# Patient Record
Sex: Female | Born: 1970 | Race: Black or African American | Hispanic: No | Smoking: Current every day smoker
Health system: Southern US, Community
[De-identification: ages and names within clinical notes are randomized; demographics above are authoritative.]

## PROBLEM LIST (undated history)

## (undated) DIAGNOSIS — I82409 Acute embolism and thrombosis of unspecified deep veins of unspecified lower extremity: Secondary | ICD-10-CM

## (undated) DIAGNOSIS — R519 Headache, unspecified: Secondary | ICD-10-CM

## (undated) DIAGNOSIS — Z9289 Personal history of other medical treatment: Secondary | ICD-10-CM

## (undated) DIAGNOSIS — F329 Major depressive disorder, single episode, unspecified: Secondary | ICD-10-CM

## (undated) DIAGNOSIS — K861 Other chronic pancreatitis: Secondary | ICD-10-CM

## (undated) DIAGNOSIS — K219 Gastro-esophageal reflux disease without esophagitis: Secondary | ICD-10-CM

## (undated) DIAGNOSIS — F32A Depression, unspecified: Secondary | ICD-10-CM

## (undated) DIAGNOSIS — J189 Pneumonia, unspecified organism: Secondary | ICD-10-CM

## (undated) DIAGNOSIS — D573 Sickle-cell trait: Secondary | ICD-10-CM

## (undated) DIAGNOSIS — R51 Headache: Secondary | ICD-10-CM

## (undated) DIAGNOSIS — N189 Chronic kidney disease, unspecified: Secondary | ICD-10-CM

---

## 2003-08-16 HISTORY — PX: CORNEAL TRANSPLANT: SHX108

## 2005-07-15 HISTORY — PX: TUBAL LIGATION: SHX77

## 2010-08-15 DIAGNOSIS — I82409 Acute embolism and thrombosis of unspecified deep veins of unspecified lower extremity: Secondary | ICD-10-CM

## 2010-08-15 HISTORY — PX: ABDOMINAL HYSTERECTOMY: SHX81

## 2010-08-15 HISTORY — DX: Acute embolism and thrombosis of unspecified deep veins of unspecified lower extremity: I82.409

## 2013-05-21 ENCOUNTER — Emergency Department (HOSPITAL_COMMUNITY)
Admission: EM | Admit: 2013-05-21 | Discharge: 2013-05-21 | Disposition: A | Discharge status: Home / Self Care | Payer: Self-pay | Attending: Emergency Medicine | Admitting: Emergency Medicine

## 2013-05-21 ENCOUNTER — Encounter (HOSPITAL_COMMUNITY): Payer: Self-pay

## 2013-05-21 DIAGNOSIS — H6691 Otitis media, unspecified, right ear: Secondary | ICD-10-CM

## 2013-05-21 DIAGNOSIS — F17200 Nicotine dependence, unspecified, uncomplicated: Secondary | ICD-10-CM | POA: Insufficient documentation

## 2013-05-21 DIAGNOSIS — Z8719 Personal history of other diseases of the digestive system: Secondary | ICD-10-CM | POA: Insufficient documentation

## 2013-05-21 DIAGNOSIS — H6690 Otitis media, unspecified, unspecified ear: Secondary | ICD-10-CM | POA: Insufficient documentation

## 2013-05-21 DIAGNOSIS — J3489 Other specified disorders of nose and nasal sinuses: Secondary | ICD-10-CM | POA: Insufficient documentation

## 2013-05-21 MED ORDER — AMOXICILLIN 500 MG PO CAPS: 1000.0000 mg | ORAL_CAPSULE | Freq: Two times a day (BID) | ORAL | Status: DC

## 2013-05-21 NOTE — ED Provider Notes (Signed)
CSN: [id number]     Arrival date & time 05/21/13  4098 History   First MD Initiated Contact with Patient 05/21/13 [phone]     Chief Complaint  Patient presents with  . Ear Fullness   (Consider location/radiation/quality/duration/timing/severity/associated sxs/prior Treatment) Patient is a 42 y.o. female presenting with ear pain.  Otalgia Location:  Right Behind ear:  No abnormality Quality:  Aching Severity:  Severe Onset quality:  Gradual Timing:  Constant Progression:  Worsening Chronicity:  New Context: not foreign body in ear and not loud noise   Relieved by:  Nothing Worsened by:  Nothing tried Associated symptoms: congestion and rhinorrhea   Associated symptoms: no abdominal pain, no cough, no diarrhea, no ear discharge, no fever, no rash, no sore throat, no tinnitus and no vomiting     Past Medical History  Diagnosis Date  . Acute pancreatitis    History reviewed. No pertinent past surgical history. History reviewed. No pertinent family history. History  Substance Use Topics  . Smoking status: Current Every Day Smoker  . Smokeless tobacco: Not on file  . Alcohol Use: No   OB History   Grav Para Term Preterm Abortions TAB SAB Ect Mult Living                 Review of Systems  Constitutional: Negative for fever and chills.  HENT: Positive for ear pain, congestion and rhinorrhea. Negative for sore throat, tinnitus and ear discharge.   Eyes: Negative for photophobia and visual disturbance.  Respiratory: Negative for cough and shortness of breath.   Cardiovascular: Negative for chest pain and leg swelling.  Gastrointestinal: Negative for nausea, vomiting, abdominal pain, diarrhea and constipation.  Endocrine: Negative for polyphagia and polyuria.  Genitourinary: Negative for dysuria, flank pain, vaginal bleeding, vaginal discharge and enuresis.  Musculoskeletal: Negative for back pain and gait problem.  Skin: Negative for color change and rash.  Neurological:  Negative for dizziness, syncope, light-headedness and numbness.  Hematological: Negative for adenopathy. Does not bruise/bleed easily.  All other systems reviewed and are negative.    Allergies  Sulfa antibiotics  Home Medications   Current Outpatient Rx  Name  Route  Sig  Dispense  Refill  . carbamide peroxide (DEBROX) 6.5 % otic solution   Right Ear   Place 5 drops into the right ear as needed.         . diphenhydrAMINE (BENADRYL) 25 MG tablet   Oral   Take 75 mg by mouth every 6 (six) hours as needed for itching.         Marland Kitchen amoxicillin (AMOXIL) 500 MG capsule   Oral   Take 2 capsules (1,000 mg total) by mouth 2 (two) times daily.   40 capsule   0    BP 139/90  Pulse 74  Temp(Src) 97.1 F (36.2 C) (Oral)  Resp 18  Ht 5\' 1"  (1.549 m)  Wt 172 lb (78.019 kg)  BMI 32.52 kg/m2  SpO2 98% Physical Exam  Vitals reviewed. Constitutional: She is oriented to person, place, and time. She appears well-developed and well-nourished.  HENT:  Head: Normocephalic and atraumatic.  Right Ear: External ear normal. Tympanic membrane is injected, erythematous and bulging. A middle ear effusion is present.  Left Ear: Tympanic membrane and external ear normal.  Eyes: Conjunctivae and EOM are normal. Pupils are equal, round, and reactive to light.  Neck: Normal range of motion. Neck supple.  Cardiovascular: Normal rate, regular rhythm, normal heart sounds and intact distal pulses.  Pulmonary/Chest: Effort normal and breath sounds normal.  Abdominal: Soft. Bowel sounds are normal. There is no tenderness.  Musculoskeletal: Normal range of motion.  Neurological: She is alert and oriented to person, place, and time.  Skin: Skin is warm and dry.    ED Course  Procedures (including critical care time) Labs Review Labs Reviewed - No data to display Imaging Review No results found.  MDM   1. Acute otitis media, right    42 y.o. female  with pertinent PMH of seasonal allergies  presents with R ear pain x 9 days with acute worsening over last 3.  Pt presents today due to progressive hearing loss.  Physical exam with L TM normal,  canal unremarkable, Hyperemic superior TM, unable to visualize entirety of TM.  No reported ottorhea.  Pt given precautions for perforated TM, will have her fu with ENT if her symptoms do not improve.  Spoke with pt on phone and she had already called ENT.  DC home in stable condition.    Labs and imaging as above reviewed by myself and attending,Dr. Rhunette Croft, with whom case was discussed.   1. Acute otitis media, right         Noel Gerold, MD 05/21/13 1021

## 2013-05-21 NOTE — ED Notes (Signed)
Pt here for difficulty hearing and ear pain on right side of face, pt has not taken meds prior to arrival, sts he doesn't take meds. sts has "distance" hearing and it is muffled.

## 2013-05-21 NOTE — ED Notes (Signed)
MD at bedside. When i attempted to triage pt.

## 2013-05-23 NOTE — ED Provider Notes (Signed)
I performed a history and physical examination of  Alyssa Flowers and discussed her management with Dr. Littie Deeds. I agree with the history, physical, assessment, and plan of care, with the following exceptions: None I was present for the following procedures: None  Time Spent in Critical Care of the patient: None  Time spent in discussions with the patient and family: 5 minutes  Alyssa Flowers  Pt comes in with hearing loss. Exam of the affected ear reveals loss of landmarks and i certainly cant see the TM. No drainage, but will tx as perforated TM and have ENT f.u   Derwood Kaplan, MD 05/23/13 [phone]

## 2014-05-12 ENCOUNTER — Inpatient Hospital Stay (HOSPITAL_COMMUNITY)
Admission: AD | Admit: 2014-05-12 | Discharge: 2014-05-12 | Disposition: A | Discharge status: Home / Self Care | Payer: Self-pay | Source: Ambulatory Visit | Attending: Family Medicine | Admitting: Family Medicine

## 2014-05-12 ENCOUNTER — Encounter (HOSPITAL_COMMUNITY): Payer: Self-pay

## 2014-05-12 DIAGNOSIS — N390 Urinary tract infection, site not specified: Secondary | ICD-10-CM | POA: Insufficient documentation

## 2014-05-12 DIAGNOSIS — F17200 Nicotine dependence, unspecified, uncomplicated: Secondary | ICD-10-CM | POA: Insufficient documentation

## 2014-05-12 LAB — URINALYSIS, ROUTINE W REFLEX MICROSCOPIC
BILIRUBIN URINE: NEGATIVE
GLUCOSE, UA: NEGATIVE mg/dL
KETONES UR: NEGATIVE mg/dL
Nitrite: POSITIVE — AB
PH: 6 (ref 5.0–8.0)
Protein, ur: NEGATIVE mg/dL
SPECIFIC GRAVITY, URINE: 1.01 (ref 1.005–1.030)
Urobilinogen, UA: 0.2 mg/dL (ref 0.0–1.0)

## 2014-05-12 LAB — URINE MICROSCOPIC-ADD ON

## 2014-05-12 MED ORDER — PHENAZOPYRIDINE HCL 200 MG PO TABS: 200.0000 mg | ORAL_TABLET | Freq: Three times a day (TID) | ORAL | Status: DC

## 2014-05-12 MED ORDER — CIPROFLOXACIN HCL 500 MG PO TABS: 500.0000 mg | ORAL_TABLET | Freq: Two times a day (BID) | ORAL | Status: DC

## 2014-05-12 NOTE — MAU Provider Note (Signed)
History    CSN: [id number]  Arrival date and time: 05/12/14 1340   First Provider Initiated Contact with Patient 05/12/14 1522      Chief Complaint  Patient presents with  . Dysuria   The history is provided by the patient.   Alyssa Flowers is a 43 y.o. female with a surgical history hysterectomy and a PMH of acute pancreatitis complaining of abdominal and flank pain. Patient states she noticed abnormal odor from her vagina about a month ago accompanied by diarrhea, concern for a UTI she began hydrating, drinking cranberry juice, and taking Tylenol. Two weeks later she began experiencing dysuria with worsening odors. 1 week ago she began having flank pain with urination. She localizes the pain at her lower abdomen and up her left flank. She describes the pain as pressure and cramps that becomes sharp when she urinates. Over the weekend she experienced a fever of 101 degrees Farenheit relieved by  tylenol and aspirin. The fever was accompanied by chills. Pt denies vaginal bleeding/discharge and new sexual partners. She does not want pelvic exam and is not concerned for STI's. For more ROS See below.  OB History   Grav Para Term Preterm Abortions TAB SAB Ect Mult Living                  Past Medical History  Diagnosis Date  . Acute pancreatitis     Past Surgical History  Procedure Laterality Date  . Abdominal hysterectomy      History reviewed. No pertinent family history.  History  Substance Use Topics  . Smoking status: Current Every Day Smoker  . Smokeless tobacco: Not on file  . Alcohol Use: No    Allergies:  Allergies  Allergen Reactions  . Sulfa Antibiotics Anaphylaxis and Hives    Prescriptions prior to admission  Medication Sig Dispense Refill  . aspirin 325 MG tablet Take 650 mg by mouth as needed for moderate pain.       Results for orders placed during the hospital encounter of 05/12/14 (from the past 24 hour(s))  URINALYSIS, ROUTINE W REFLEX MICROSCOPIC      Status: Abnormal   Collection Time    05/12/14  2:43 PM      Result Value Ref Range   Color, Urine YELLOW  YELLOW   APPearance CLOUDY (*) CLEAR   Specific Gravity, Urine 1.010  1.005 - 1.030   pH 6.0  5.0 - 8.0   Glucose, UA NEGATIVE  NEGATIVE mg/dL   Hgb urine dipstick SMALL (*) NEGATIVE   Bilirubin Urine NEGATIVE  NEGATIVE   Ketones, ur NEGATIVE  NEGATIVE mg/dL   Protein, ur NEGATIVE  NEGATIVE mg/dL   Urobilinogen, UA 0.2  0.0 - 1.0 mg/dL   Nitrite POSITIVE (*) NEGATIVE   Leukocytes, UA LARGE (*) NEGATIVE  URINE MICROSCOPIC-ADD ON     Status: Abnormal   Collection Time    05/12/14  2:43 PM      Result Value Ref Range   Squamous Epithelial / LPF MANY (*) RARE   WBC, UA TOO NUMEROUS TO COUNT  <3 WBC/hpf   RBC / HPF 7-10  <3 RBC/hpf   Bacteria, UA MANY (*) RARE   Urine-Other MUCOUS PRESENT       Review of Systems  Constitutional: Positive for fever, chills and malaise/fatigue. Negative for weight loss and diaphoresis.  HENT: Negative for congestion and sore throat.   Eyes: Negative for blurred vision.  Respiratory: Negative for cough.   Cardiovascular: Negative for  chest pain.  Gastrointestinal: Positive for nausea, vomiting, abdominal pain and diarrhea. Negative for constipation and blood in stool.  Genitourinary: Positive for dysuria, urgency, frequency and flank pain. Negative for hematuria.  Skin: Negative for itching and rash.  Neurological: Positive for headaches. Negative for dizziness and weakness.   Physical Exam   Blood pressure 120/79, pulse 80, temperature 98.4 F (36.9 C), temperature source Oral, resp. rate 18, height  (1.575 m), weight 80.287 kg (177 lb).  Physical Exam  Constitutional: She is oriented to person, place, and time. She appears well-developed and well-nourished. She appears distressed (mild).  HENT:  Head: Normocephalic.  Eyes: EOM are normal.  Neck: Normal range of motion.  Cardiovascular: Normal rate, regular rhythm and normal heart  sounds.   Respiratory: Effort normal.  GI: Soft. Normal appearance and bowel sounds are normal. There is tenderness in the suprapubic area. There is CVA tenderness (left mild).  Neurological: She is alert and oriented to person, place, and time.  Skin: Skin is warm and dry.  Psychiatric: She has a normal mood and affect. Her behavior is normal.  Pelvic exam: exam declined by the patient.  MAU Course  Procedures  MDM Patient stable during visit at MAU   Assessment and Plan  A: Uncomplicated UTI  P: Cipro       Pyridium     Stable for discharge without fever, nausea, vomiting or other problems. Will treat outpatient. She will return for worsening symptoms.  Discussed with the patient and all questioned fully answered.    Medication List         aspirin 325 MG tablet  Take 650 mg by mouth as needed for moderate pain.     ciprofloxacin 500 MG tablet  Commonly known as:  CIPRO  Take 1 tablet (500 mg total) by mouth 2 (two) times daily.     phenazopyridine 200 MG tablet  Commonly known as:  PYRIDIUM  Take 1 tablet (200 mg total) by mouth 3 (three) times daily.           Rosemary Holms 05/12/2014, 3:26 PM

## 2014-05-12 NOTE — MAU Provider Note (Signed)
Attestation of Attending Supervision of Advanced Practitioner (PA/CNM/NP): Evaluation and management procedures were performed by the Advanced Practitioner under my supervision and collaboration.  I have reviewed the Advanced Practitioner's note and chart, and I agree with the management and plan.  Rowena Moilanen, DO Attending Physician Faculty Practice, [facility]   

## 2014-05-12 NOTE — MAU Note (Addendum)
Think has a urinary tract inf.  Urine is hazy orange color, strong odor.  Pain in lower abd. Frequency/ urgeny, pain and pressure with urination.  Has had total hyst- no other reason. Fever last 2 days, up to 101

## 2014-06-26 ENCOUNTER — Inpatient Hospital Stay (HOSPITAL_COMMUNITY): Payer: Self-pay

## 2014-06-26 ENCOUNTER — Inpatient Hospital Stay (HOSPITAL_COMMUNITY)
Admission: AD | Admit: 2014-06-26 | Discharge: 2014-06-26 | Disposition: A | Discharge status: Home / Self Care | Payer: Self-pay | Source: Ambulatory Visit | Attending: Obstetrics & Gynecology | Admitting: Obstetrics & Gynecology

## 2014-06-26 ENCOUNTER — Encounter (HOSPITAL_COMMUNITY): Payer: Self-pay | Admitting: *Deleted

## 2014-06-26 DIAGNOSIS — Z90710 Acquired absence of both cervix and uterus: Secondary | ICD-10-CM | POA: Diagnosis present

## 2014-06-26 DIAGNOSIS — R109 Unspecified abdominal pain: Secondary | ICD-10-CM | POA: Insufficient documentation

## 2014-06-26 DIAGNOSIS — E669 Obesity, unspecified: Secondary | ICD-10-CM | POA: Diagnosis present

## 2014-06-26 DIAGNOSIS — F17200 Nicotine dependence, unspecified, uncomplicated: Secondary | ICD-10-CM | POA: Diagnosis present

## 2014-06-26 DIAGNOSIS — R05 Cough: Secondary | ICD-10-CM | POA: Insufficient documentation

## 2014-06-26 DIAGNOSIS — R059 Cough, unspecified: Secondary | ICD-10-CM

## 2014-06-26 LAB — URINE MICROSCOPIC-ADD ON

## 2014-06-26 LAB — CBC
HCT: 32.9 % — ABNORMAL LOW (ref 36.0–46.0)
Hemoglobin: 11.8 g/dL — ABNORMAL LOW (ref 12.0–15.0)
MCH: 30.2 pg (ref 26.0–34.0)
MCHC: 35.9 g/dL (ref 30.0–36.0)
MCV: 84.1 fL (ref 78.0–100.0)
PLATELETS: 311 10*3/uL (ref 150–400)
RBC: 3.91 MIL/uL (ref 3.87–5.11)
RDW: 15.3 % (ref 11.5–15.5)
WBC: 11.3 10*3/uL — AB (ref 4.0–10.5)

## 2014-06-26 LAB — URINALYSIS, ROUTINE W REFLEX MICROSCOPIC
Bilirubin Urine: NEGATIVE
Glucose, UA: NEGATIVE mg/dL
KETONES UR: NEGATIVE mg/dL
LEUKOCYTES UA: NEGATIVE
NITRITE: NEGATIVE
PROTEIN: NEGATIVE mg/dL
Specific Gravity, Urine: <1.005 — ABNORMAL LOW (ref 1.005–1.030)
UROBILINOGEN UA: 0.2 mg/dL (ref 0.0–1.0)
pH: 5.5 (ref 5.0–8.0)

## 2014-06-26 LAB — COMPREHENSIVE METABOLIC PANEL
ALBUMIN: 3.5 g/dL (ref 3.5–5.2)
ALK PHOS: 72 U/L (ref 39–117)
ALT: 19 U/L (ref 0–35)
ANION GAP: 14 (ref 5–15)
AST: 21 U/L (ref 0–37)
BUN: 7 mg/dL (ref 6–23)
CHLORIDE: 102 meq/L (ref 96–112)
CO2: 23 mEq/L (ref 19–32)
Calcium: 9.1 mg/dL (ref 8.4–10.5)
Creatinine, Ser: 0.81 mg/dL (ref 0.50–1.10)
GFR calc Af Amer: >90 mL/min (ref >90)
GFR calc non Af Amer: 90 mL/min — ABNORMAL LOW (ref >90)
Glucose, Bld: 108 mg/dL — ABNORMAL HIGH (ref 70–99)
POTASSIUM: 3.4 meq/L — AB (ref 3.7–5.3)
Sodium: 139 mEq/L (ref 137–147)
TOTAL PROTEIN: 7.1 g/dL (ref 6.0–8.3)
Total Bilirubin: 0.3 mg/dL (ref 0.3–1.2)

## 2014-06-26 MED ORDER — PROMETHAZINE HCL 25 MG PO TABS
25.0000 mg | ORAL_TABLET | Freq: Once | ORAL | Status: AC
  Administered 2014-06-26: 25 mg via ORAL
  Filled 2014-06-26: qty 1

## 2014-06-26 MED ORDER — OXYCODONE-ACETAMINOPHEN 5-325 MG PO TABS: 1.0000 | ORAL_TABLET | ORAL | Status: DC | PRN

## 2014-06-26 MED ORDER — AZITHROMYCIN 250 MG PO TABS: ORAL_TABLET | ORAL | Status: DC

## 2014-06-26 MED ORDER — PROMETHAZINE HCL 25 MG PO TABS: 25.0000 mg | ORAL_TABLET | Freq: Four times a day (QID) | ORAL | Status: AC | PRN

## 2014-06-26 MED ORDER — OXYCODONE-ACETAMINOPHEN 5-325 MG PO TABS
1.0000 | ORAL_TABLET | Freq: Once | ORAL | Status: AC
  Administered 2014-06-26: 1 via ORAL
  Filled 2014-06-26: qty 1

## 2014-06-26 NOTE — MAU Note (Signed)
Pt states she has had a cough 10days when she states she had a cold. Pt states she has been taking over the counter medication. Pt states she feels like she has had "the hernia's for a while, now it is starting to protrude out. I have not felt this for a while. Pt states she felt a warm sensation around the incision area. From C-section she had 08/18/2010,

## 2014-06-26 NOTE — MAU Provider Note (Signed)
History     CSN: [id number]  Arrival date and time: 06/26/14 [id number]   First Provider Initiated Contact with Patient 06/26/14 0126      Chief Complaint  Patient presents with  . Abdominal Pain  . Cough   HPI Comments: Alyssa Flowers 43 y.o. Alyssa [id number] presents to MAU with severe cough that is ongoing for 10 day and productive of yellow sputum. She denies any fever. She is a smoker. She has been coughing so much she feels she had created a hernia in her hysterectomy site or pulled a muscle. [place] She has hx acute pancreatitis related to trauma.  Pt does not have PCP or see MD.  Abdominal Pain  Cough      Past Medical History  Diagnosis Date  . Acute pancreatitis     Past Surgical History  Procedure Laterality Date  . Abdominal hysterectomy      Family History  Problem Relation Age of Onset  . Hypertension Mother   . Hyperlipidemia Mother   . Heart disease Father   . Hypertension Father   . Hyperlipidemia Father     History  Substance Use Topics  . Smoking status: Current Every Day Smoker  . Smokeless tobacco: Not on file  . Alcohol Use: No    Allergies:  Allergies  Allergen Reactions  . Sulfa Antibiotics Anaphylaxis and Hives    Prescriptions prior to admission  Medication Sig Dispense Refill Last Dose  . aspirin 325 MG tablet Take 650 mg by mouth as needed for moderate pain.   06/25/2014 at Unknown time  . ciprofloxacin (CIPRO) 500 MG tablet Take 1 tablet (500 mg total) by mouth 2 (two) times daily. 14 tablet 0   . phenazopyridine (PYRIDIUM) 200 MG tablet Take 1 tablet (200 mg total) by mouth 3 (three) times daily. 6 tablet 0     Review of Systems  Constitutional: Negative.   HENT: Negative.   Respiratory: Positive for cough.   Cardiovascular: Negative.   Gastrointestinal: Positive for abdominal pain.  Genitourinary: Negative.   Musculoskeletal: Negative.   Skin: Negative.   Neurological: Negative.    Psychiatric/Behavioral: Negative.    Physical Exam   Blood pressure 130/86, pulse 87, temperature 97.6 F (36.4 C), temperature source Oral, resp. rate 20, SpO2 100 %.  Physical Exam  Constitutional: She is oriented to person, place, and time. She appears well-developed and well-nourished.  Coughing is significant  HENT:  Head: Normocephalic and atraumatic.  Eyes: Pupils are equal, round, and reactive to light.  Cardiovascular: Normal rate, regular rhythm and normal heart sounds.   Respiratory: Effort normal.  Lungs have distant breath sounds that what maybe consolidation on right side  GI:  Old well healed scar /tint to skin around area of scar/ tender to touch/ nothing protruding / no evidence of hernia  Genitourinary:  Not examined  Musculoskeletal: Normal range of motion.  Neurological: She is alert and oriented to person, place, and time.  Skin: Skin is warm and dry.  Psychiatric: She has a normal mood and affect. Her behavior is normal. Judgment and thought content normal.   Results for orders placed or performed during the hospital encounter of 06/26/14 (from the past 24 hour(s))  CBC     Status: Abnormal   Collection Time: 06/26/14  2:13 AM  Result Value Ref Range   WBC 11.3 (H) 4.0 - 10.5 K/uL   RBC 3.91 3.87 - 5.11 MIL/uL   Hemoglobin 11.8 (L)  12.0 - 15.0 g/dL   HCT 78.232.9 (L) 95.636.0 - 21.346.0 %   MCV 84.1 78.0 - 100.0 fL   MCH 30.2 26.0 - 34.0 pg   MCHC 35.9 30.0 - 36.0 g/dL   RDW 08.615.3 57.811.5 - 46.915.5 %   Platelets 311 150 - 400 K/uL  Comprehensive metabolic panel     Status: Abnormal   Collection Time: 06/26/14  2:13 AM  Result Value Ref Range   Sodium 139 137 - 147 mEq/L   Potassium 3.4 (L) 3.7 - 5.3 mEq/L   Chloride 102 96 - 112 mEq/L   CO2 23 19 - 32 mEq/L   Glucose, Bld 108 (H) 70 - 99 mg/dL   BUN 7 6 - 23 mg/dL   Creatinine, Ser 6.290.81 0.50 - 1.10 mg/dL   Calcium 9.1 8.4 - 52.810.5 mg/dL   Total Protein 7.1 6.0 - 8.3 g/dL   Albumin 3.5 3.5 - 5.2 g/dL   AST 21 0 -  37 U/L   ALT 19 0 - 35 U/L   Alkaline Phosphatase 72 39 - 117 U/L   Total Bilirubin 0.3 0.3 - 1.2 mg/dL   GFR calc non Af Amer 90 (L) >90 mL/min   GFR calc Af Amer >90 >90 mL/min   Anion gap 14 5 - 15   Dg Chest 2 View  06/26/2014   CLINICAL DATA:  Cough and cold 10 days.  EXAM: CHEST  2 VIEW  COMPARISON:  None.  FINDINGS: Lungs are adequately inflated with minimal tenting of the right hemidiaphragm likely atelectasis. Cardiomediastinal silhouette, bones and soft tissues are normal.  IMPRESSION: No active cardiopulmonary disease.   Electronically Signed   By: Elberta Fortisaniel  Boyle M.D.   On: 06/26/2014 02:45   O2 sat 97%  MAU Course  Procedures  MDM  Percocet/ Phenergan/ improved cough and pain CBC/ CMET/UA/ CXR/ O2sat  Assessment and Plan   A: Cough  P: Above orders reviewed with patient and husband Z-Pack as directed Increase fluids/ rest Percocet/ phenergan for cough and pain Note to be out of work till Monday Advised to find PCP Advised to stop smoking   Carolynn ServeBarefoot, Alyssa Flowers 06/26/2014, 1:49 AM

## 2014-06-26 NOTE — MAU Note (Signed)
Pt reports she thinks she has a hernia at her hysterectomy site. Hysterectomy was in 2012, pt reports she has been coughing and now has a lot of pain in that area.

## 2014-06-26 NOTE — Progress Notes (Signed)
Pt states pain increase to a 10 when she coughs

## 2014-06-26 NOTE — Discharge Instructions (Signed)
Cool Mist Vaporizers Vaporizers may help relieve the symptoms of a cough and cold. They add moisture to the air, which helps mucus to become thinner and less sticky. This makes it easier to breathe and cough up secretions. Cool mist vaporizers do not cause serious burns like hot mist vaporizers, which may also be called steamers or humidifiers. Vaporizers have not been proven to help with colds. You should not use a vaporizer if you are allergic to mold. HOME CARE INSTRUCTIONS  Follow the package instructions for the vaporizer.  Do not use anything other than distilled water in the vaporizer.  Do not run the vaporizer all of the time. This can cause mold or bacteria to grow in the vaporizer.  Clean the vaporizer after each time it is used.  Clean and dry the vaporizer well before storing it.  Stop using the vaporizer if worsening respiratory symptoms develop. Document Released: 04/28/2004 Document Revised: 08/06/2013 Document Reviewed: 12/19/2012 [facility] IncExitCare Patient Information 2015 [place], [place]. This information is not intended to replace advice given to you by your health care provider. Make sure you discuss any questions you have with your health care provider.  Cough, Adult  A cough is a reflex. It helps you clear your throat and airways. A cough can help heal your body. A cough can last 2 or 3 weeks (acute) or may last more than 8 weeks (chronic). Some common causes of a cough can include an infection, allergy, or a cold. HOME CARE  Only take medicine as told by your doctor.  If given, take your medicines (antibiotics) as told. Finish them even if you start to feel better.  Use a cold steam vaporizer or humidifier in your home. This can help loosen thick spit (secretions).  Sleep so you are almost sitting up (semi-upright). Use pillows to do this. This helps reduce coughing.  Rest as needed.  Stop smoking if you smoke. GET HELP RIGHT AWAY IF:  You have yellowish-white fluid  (pus) in your thick spit.  Your cough gets worse.  Your medicine does not reduce coughing, and you are losing sleep.  You cough up blood.  You have trouble breathing.  Your pain gets worse and medicine does not help.  You have a fever. MAKE SURE YOU:   Understand these instructions.  Will watch your condition.  Will get help right away if you are not doing well or get worse. Document Released: 04/14/2011 Document Revised: 12/16/2013 Document Reviewed: 04/14/2011 [facility] Patient Information 2015 [employer] [place]Care, [place]. This information is not intended to replace advice given to you by your health care provider. Make sure you discuss any questions you have with your health care provider. Pneumonia Pneumonia is an infection of the lungs.  CAUSES Pneumonia may be caused by bacteria or a virus. Usually, these infections are caused by breathing infectious particles into the lungs (respiratory tract). SIGNS AND SYMPTOMS   Cough.  Fever.  Chest pain.  Increased rate of breathing.  Wheezing.  Mucus production. DIAGNOSIS  If you have the common symptoms of pneumonia, your health care provider will typically confirm the diagnosis with a chest X-ray. The X-ray will show an abnormality in the lung (pulmonary infiltrate) if you have pneumonia. Other tests of your blood, urine, or sputum may be done to find the specific cause of your pneumonia. Your health care provider may also do tests (blood gases or pulse oximetry) to see how well your lungs are working. TREATMENT  Some forms of pneumonia may be spread to  other people when you cough or sneeze. You may be asked to wear a mask before and during your exam. Pneumonia that is caused by bacteria is treated with antibiotic medicine. Pneumonia that is caused by the influenza virus may be treated with an antiviral medicine. Most other viral infections must run their course. These infections will not respond to antibiotics.  HOME CARE INSTRUCTIONS     Cough suppressants may be used if you are losing too much rest. However, coughing protects you by clearing your lungs. You should avoid using cough suppressants if you can.  Your health care provider may have prescribed medicine if he or she thinks your pneumonia is caused by bacteria or influenza. Finish your medicine even if you start to feel better.  Your health care provider may also prescribe an expectorant. This loosens the mucus to be coughed up.  Take medicines only as directed by your health care provider.  Do not smoke. Smoking is a common cause of bronchitis and can contribute to pneumonia. If you are a smoker and continue to smoke, your cough may last several weeks after your pneumonia has cleared.  A cold steam vaporizer or humidifier in your room or home may help loosen mucus.  Coughing is often worse at night. Sleeping in a semi-upright position in a recliner or using a couple pillows under your head will help with this.  Get rest as you feel it is needed. Your body will usually let you know when you need to rest. PREVENTION A pneumococcal shot (vaccine) is available to prevent a common bacterial cause of pneumonia. This is usually suggested for:  People over 41 years old.  Patients on chemotherapy.  People with chronic lung problems, such as bronchitis or emphysema.  People with immune system problems. If you are over 65 or have a high risk condition, you may receive the pneumococcal vaccine if you have not received it before. In some countries, a routine influenza vaccine is also recommended. This vaccine can help prevent some cases of pneumonia.You may be offered the influenza vaccine as part of your care. If you smoke, it is time to quit. You may receive instructions on how to stop smoking. Your health care provider can provide medicines and counseling to help you quit. SEEK MEDICAL CARE IF: You have a fever. SEEK IMMEDIATE MEDICAL CARE IF:   Your illness  becomes worse. This is especially true if you are elderly or weakened from any other disease.  You cannot control your cough with suppressants and are losing sleep.  You begin coughing up blood.  You develop pain which is getting worse or is uncontrolled with medicines.  Any of the symptoms which initially brought you in for treatment are getting worse rather than better.  You develop shortness of breath or chest pain. MAKE SURE YOU:   Understand these instructions.  Will watch your condition.  Will get help right away if you are not doing well or get worse. Document Released: 08/01/2005 Document Revised: 12/16/2013 Document Reviewed: 10/21/2010 [facility] HospitalExitCare Patient Information 2015 [place], [place]LL[place]. This information is not intended to replace advice given to you by your health care provider. Make sure you discuss any questions you have with your health care provider.

## 2015-02-05 ENCOUNTER — Observation Stay (HOSPITAL_COMMUNITY)
Admission: EM | Admit: 2015-02-05 | Discharge: 2015-02-06 | Disposition: A | Discharge status: Home / Self Care | Payer: Medicaid Other | Attending: Internal Medicine | Admitting: Internal Medicine

## 2015-02-05 ENCOUNTER — Other Ambulatory Visit (HOSPITAL_COMMUNITY): Payer: Self-pay

## 2015-02-05 ENCOUNTER — Emergency Department (HOSPITAL_COMMUNITY): Payer: Medicaid Other

## 2015-02-05 ENCOUNTER — Encounter (HOSPITAL_COMMUNITY): Payer: Self-pay | Admitting: *Deleted

## 2015-02-05 ENCOUNTER — Other Ambulatory Visit: Payer: Self-pay

## 2015-02-05 DIAGNOSIS — R0602 Shortness of breath: Secondary | ICD-10-CM | POA: Insufficient documentation

## 2015-02-05 DIAGNOSIS — R0981 Nasal congestion: Secondary | ICD-10-CM | POA: Insufficient documentation

## 2015-02-05 DIAGNOSIS — E876 Hypokalemia: Secondary | ICD-10-CM | POA: Diagnosis present

## 2015-02-05 DIAGNOSIS — E669 Obesity, unspecified: Secondary | ICD-10-CM | POA: Diagnosis not present

## 2015-02-05 DIAGNOSIS — K859 Acute pancreatitis, unspecified: Secondary | ICD-10-CM | POA: Insufficient documentation

## 2015-02-05 DIAGNOSIS — F17210 Nicotine dependence, cigarettes, uncomplicated: Secondary | ICD-10-CM | POA: Insufficient documentation

## 2015-02-05 DIAGNOSIS — R059 Cough, unspecified: Secondary | ICD-10-CM

## 2015-02-05 DIAGNOSIS — J302 Other seasonal allergic rhinitis: Secondary | ICD-10-CM | POA: Insufficient documentation

## 2015-02-05 DIAGNOSIS — E878 Other disorders of electrolyte and fluid balance, not elsewhere classified: Secondary | ICD-10-CM

## 2015-02-05 DIAGNOSIS — J329 Chronic sinusitis, unspecified: Secondary | ICD-10-CM | POA: Diagnosis not present

## 2015-02-05 DIAGNOSIS — Z7982 Long term (current) use of aspirin: Secondary | ICD-10-CM | POA: Diagnosis not present

## 2015-02-05 DIAGNOSIS — R05 Cough: Secondary | ICD-10-CM | POA: Diagnosis present

## 2015-02-05 DIAGNOSIS — E785 Hyperlipidemia, unspecified: Secondary | ICD-10-CM | POA: Diagnosis not present

## 2015-02-05 DIAGNOSIS — Z720 Tobacco use: Secondary | ICD-10-CM | POA: Diagnosis present

## 2015-02-05 DIAGNOSIS — K861 Other chronic pancreatitis: Secondary | ICD-10-CM | POA: Diagnosis not present

## 2015-02-05 DIAGNOSIS — J189 Pneumonia, unspecified organism: Secondary | ICD-10-CM | POA: Diagnosis not present

## 2015-02-05 DIAGNOSIS — Z882 Allergy status to sulfonamides status: Secondary | ICD-10-CM | POA: Insufficient documentation

## 2015-02-05 DIAGNOSIS — K219 Gastro-esophageal reflux disease without esophagitis: Secondary | ICD-10-CM | POA: Diagnosis not present

## 2015-02-05 DIAGNOSIS — R079 Chest pain, unspecified: Secondary | ICD-10-CM | POA: Diagnosis not present

## 2015-02-05 DIAGNOSIS — Z881 Allergy status to other antibiotic agents status: Secondary | ICD-10-CM | POA: Diagnosis not present

## 2015-02-05 DIAGNOSIS — Z6834 Body mass index (BMI) 34.0-34.9, adult: Secondary | ICD-10-CM | POA: Diagnosis not present

## 2015-02-05 DIAGNOSIS — E538 Deficiency of other specified B group vitamins: Secondary | ICD-10-CM | POA: Diagnosis present

## 2015-02-05 DIAGNOSIS — D649 Anemia, unspecified: Secondary | ICD-10-CM | POA: Diagnosis not present

## 2015-02-05 DIAGNOSIS — R7309 Other abnormal glucose: Secondary | ICD-10-CM

## 2015-02-05 DIAGNOSIS — R7303 Prediabetes: Secondary | ICD-10-CM | POA: Diagnosis present

## 2015-02-05 HISTORY — DX: Major depressive disorder, single episode, unspecified: F32.9

## 2015-02-05 HISTORY — DX: Headache: R51

## 2015-02-05 HISTORY — DX: Headache, unspecified: R51.9

## 2015-02-05 HISTORY — DX: Pneumonia, unspecified organism: J18.9

## 2015-02-05 HISTORY — DX: Acute embolism and thrombosis of unspecified deep veins of unspecified lower extremity: I82.409

## 2015-02-05 HISTORY — DX: Personal history of other medical treatment: Z92.89

## 2015-02-05 HISTORY — DX: Sickle-cell trait: D57.3

## 2015-02-05 HISTORY — DX: Chronic kidney disease, unspecified: N18.9

## 2015-02-05 HISTORY — DX: Other chronic pancreatitis: K86.1

## 2015-02-05 HISTORY — DX: Gastro-esophageal reflux disease without esophagitis: K21.9

## 2015-02-05 HISTORY — DX: Depression, unspecified: F32.A

## 2015-02-05 LAB — URINALYSIS, ROUTINE W REFLEX MICROSCOPIC
BILIRUBIN URINE: NEGATIVE
GLUCOSE, UA: NEGATIVE mg/dL
Ketones, ur: NEGATIVE mg/dL
LEUKOCYTES UA: NEGATIVE
Nitrite: POSITIVE — AB
PROTEIN: NEGATIVE mg/dL
Specific Gravity, Urine: 1.008 (ref 1.005–1.030)
Urobilinogen, UA: 0.2 mg/dL (ref 0.0–1.0)
pH: 6 (ref 5.0–8.0)

## 2015-02-05 LAB — LIPID PANEL
Cholesterol: 233 mg/dL — ABNORMAL HIGH (ref 0–200)
HDL: 32 mg/dL — ABNORMAL LOW (ref >40)
LDL CALC: 151 mg/dL — AB (ref 0–99)
TRIGLYCERIDES: 251 mg/dL — AB (ref <150)
Total CHOL/HDL Ratio: 7.3 RATIO
VLDL: 50 mg/dL — ABNORMAL HIGH (ref 0–40)

## 2015-02-05 LAB — CBC WITH DIFFERENTIAL/PLATELET
Basophils Absolute: 0 10*3/uL (ref 0.0–0.1)
Basophils Relative: 0 % (ref 0–1)
EOS ABS: 0.2 10*3/uL (ref 0.0–0.7)
Eosinophils Relative: 3 % (ref 0–5)
HEMATOCRIT: 34.6 % — AB (ref 36.0–46.0)
Hemoglobin: 11.8 g/dL — ABNORMAL LOW (ref 12.0–15.0)
LYMPHS PCT: 39 % (ref 12–46)
Lymphs Abs: 2.7 10*3/uL (ref 0.7–4.0)
MCH: 27.6 pg (ref 26.0–34.0)
MCHC: 34.1 g/dL (ref 30.0–36.0)
MCV: 80.8 fL (ref 78.0–100.0)
Monocytes Absolute: 0.6 10*3/uL (ref 0.1–1.0)
Monocytes Relative: 9 % (ref 3–12)
Neutro Abs: 3.4 10*3/uL (ref 1.7–7.7)
Neutrophils Relative %: 49 % (ref 43–77)
PLATELETS: 323 10*3/uL (ref 150–400)
RBC: 4.28 MIL/uL (ref 3.87–5.11)
RDW: 14.9 % (ref 11.5–15.5)
WBC: 6.9 10*3/uL (ref 4.0–10.5)

## 2015-02-05 LAB — COMPREHENSIVE METABOLIC PANEL
ALBUMIN: 3.6 g/dL (ref 3.5–5.0)
ALT: 23 U/L (ref 14–54)
AST: 28 U/L (ref 15–41)
Alkaline Phosphatase: 67 U/L (ref 38–126)
Anion gap: 7 (ref 5–15)
BUN: <5 mg/dL — ABNORMAL LOW (ref 6–20)
CALCIUM: 9.3 mg/dL (ref 8.9–10.3)
CO2: 27 mmol/L (ref 22–32)
Chloride: 105 mmol/L (ref 101–111)
Creatinine, Ser: 0.92 mg/dL (ref 0.44–1.00)
GFR calc non Af Amer: >60 mL/min (ref >60)
GLUCOSE: 106 mg/dL — AB (ref 65–99)
POTASSIUM: 2.7 mmol/L — AB (ref 3.5–5.1)
SODIUM: 139 mmol/L (ref 135–145)
TOTAL PROTEIN: 7.7 g/dL (ref 6.5–8.1)
Total Bilirubin: 0.6 mg/dL (ref 0.3–1.2)

## 2015-02-05 LAB — LIPASE, BLOOD: LIPASE: 43 U/L (ref 22–51)

## 2015-02-05 LAB — BASIC METABOLIC PANEL
Anion gap: 8 (ref 5–15)
BUN: <5 mg/dL — ABNORMAL LOW (ref 6–20)
CO2: 26 mmol/L (ref 22–32)
Calcium: 8.6 mg/dL — ABNORMAL LOW (ref 8.9–10.3)
Chloride: 103 mmol/L (ref 101–111)
Creatinine, Ser: 0.82 mg/dL (ref 0.44–1.00)
GLUCOSE: 119 mg/dL — AB (ref 65–99)
POTASSIUM: 2.5 mmol/L — AB (ref 3.5–5.1)
SODIUM: 137 mmol/L (ref 135–145)

## 2015-02-05 LAB — APTT: aPTT: 31 seconds (ref 24–37)

## 2015-02-05 LAB — URINE MICROSCOPIC-ADD ON

## 2015-02-05 LAB — I-STAT TROPONIN, ED: TROPONIN I, POC: 0 ng/mL (ref 0.00–0.08)

## 2015-02-05 LAB — PROCALCITONIN: Procalcitonin: <0.1 ng/mL

## 2015-02-05 LAB — STREP PNEUMONIAE URINARY ANTIGEN: STREP PNEUMO URINARY ANTIGEN: NEGATIVE

## 2015-02-05 LAB — MRSA PCR SCREENING: MRSA by PCR: NEGATIVE

## 2015-02-05 LAB — TECHNOLOGIST SMEAR REVIEW

## 2015-02-05 LAB — PROTIME-INR
INR: 1.12 (ref 0.00–1.49)
PROTHROMBIN TIME: 14.6 s (ref 11.6–15.2)

## 2015-02-05 LAB — MAGNESIUM: MAGNESIUM: 2.3 mg/dL (ref 1.7–2.4)

## 2015-02-05 LAB — PHOSPHORUS: Phosphorus: 3.9 mg/dL (ref 2.5–4.6)

## 2015-02-05 LAB — LACTIC ACID, PLASMA: Lactic Acid, Venous: 1.5 mmol/L (ref 0.5–2.0)

## 2015-02-05 LAB — ETHANOL: Alcohol, Ethyl (B): <5 mg/dL (ref <5)

## 2015-02-05 MED ORDER — DEXTROSE 5 % IV SOLN
1.0000 g | Freq: Once | INTRAVENOUS | Status: AC
  Administered 2015-02-05: 1 g via INTRAVENOUS
  Filled 2015-02-05: qty 10

## 2015-02-05 MED ORDER — DEXTROSE 5 % IV SOLN
500.0000 mg | Freq: Once | INTRAVENOUS | Status: AC
  Administered 2015-02-05: 500 mg via INTRAVENOUS
  Filled 2015-02-05: qty 500

## 2015-02-05 MED ORDER — SODIUM CHLORIDE 0.9 % IV SOLN
INTRAVENOUS | Status: DC
  Administered 2015-02-05 – 2015-02-06 (×2): via INTRAVENOUS

## 2015-02-05 MED ORDER — BENZONATATE 100 MG PO CAPS
100.0000 mg | ORAL_CAPSULE | Freq: Two times a day (BID) | ORAL | Status: DC
  Administered 2015-02-05 – 2015-02-06 (×2): 100 mg via ORAL
  Filled 2015-02-05 (×3): qty 1

## 2015-02-05 MED ORDER — MAGNESIUM SULFATE 2 GM/50ML IV SOLN
2.0000 g | Freq: Once | INTRAVENOUS | Status: AC
  Administered 2015-02-05: 2 g via INTRAVENOUS
  Filled 2015-02-05: qty 50

## 2015-02-05 MED ORDER — ALBUTEROL SULFATE (2.5 MG/3ML) 0.083% IN NEBU: 5.0000 mg | INHALATION_SOLUTION | RESPIRATORY_TRACT | Status: DC | PRN

## 2015-02-05 MED ORDER — AZITHROMYCIN 250 MG PO TABS
500.0000 mg | ORAL_TABLET | Freq: Once | ORAL | Status: DC
Start: 2015-02-05 — End: 2015-02-05

## 2015-02-05 MED ORDER — SODIUM CHLORIDE 0.9 % IV BOLUS (SEPSIS)
1000.0000 mL | Freq: Once | INTRAVENOUS | Status: AC
  Administered 2015-02-05: 1000 mL via INTRAVENOUS

## 2015-02-05 MED ORDER — IBUPROFEN 200 MG PO TABS: 800.0000 mg | ORAL_TABLET | Freq: Four times a day (QID) | ORAL | Status: DC | PRN

## 2015-02-05 MED ORDER — IBUPROFEN 200 MG PO TABS
400.0000 mg | ORAL_TABLET | Freq: Four times a day (QID) | ORAL | Status: DC | PRN
  Administered 2015-02-05: 400 mg via ORAL
  Filled 2015-02-05: qty 2

## 2015-02-05 MED ORDER — DEXTROSE 5 % IV SOLN: 250.0000 mg | INTRAVENOUS | Status: DC

## 2015-02-05 MED ORDER — CEFTRIAXONE SODIUM IN DEXTROSE 20 MG/ML IV SOLN: 1.0000 g | INTRAVENOUS | Status: DC

## 2015-02-05 MED ORDER — FLUTICASONE PROPIONATE 50 MCG/ACT NA SUSP
2.0000 | Freq: Every day | NASAL | Status: DC
  Administered 2015-02-05 – 2015-02-06 (×2): 2 via NASAL
  Filled 2015-02-05: qty 16

## 2015-02-05 MED ORDER — POTASSIUM CHLORIDE CRYS ER 20 MEQ PO TBCR
40.0000 meq | EXTENDED_RELEASE_TABLET | Freq: Once | ORAL | Status: AC
  Administered 2015-02-05: 40 meq via ORAL
  Filled 2015-02-05 (×2): qty 2

## 2015-02-05 MED ORDER — ENOXAPARIN SODIUM 40 MG/0.4ML ~~LOC~~ SOLN
40.0000 mg | SUBCUTANEOUS | Status: DC
  Administered 2015-02-05: 40 mg via SUBCUTANEOUS
  Filled 2015-02-05 (×2): qty 0.4

## 2015-02-05 MED ORDER — POTASSIUM CHLORIDE 10 MEQ/100ML IV SOLN
10.0000 meq | Freq: Once | INTRAVENOUS | Status: AC
  Administered 2015-02-05: 10 meq via INTRAVENOUS
  Filled 2015-02-05: qty 100

## 2015-02-05 MED ORDER — DM-GUAIFENESIN ER 30-600 MG PO TB12
1.0000 | ORAL_TABLET | Freq: Two times a day (BID) | ORAL | Status: DC
  Filled 2015-02-05: qty 1

## 2015-02-05 MED ORDER — POTASSIUM CHLORIDE 10 MEQ/100ML IV SOLN
10.0000 meq | INTRAVENOUS | Status: AC
  Administered 2015-02-06 (×4): 10 meq via INTRAVENOUS
  Filled 2015-02-05 (×4): qty 100

## 2015-02-05 MED ORDER — ACETAMINOPHEN 325 MG PO TABS: 650.0000 mg | ORAL_TABLET | ORAL | Status: DC | PRN

## 2015-02-05 MED ORDER — LORATADINE 10 MG PO TABS
10.0000 mg | ORAL_TABLET | Freq: Every day | ORAL | Status: DC
  Administered 2015-02-05 – 2015-02-06 (×2): 10 mg via ORAL
  Filled 2015-02-05 (×2): qty 1

## 2015-02-05 MED ORDER — PROMETHAZINE HCL 25 MG/ML IJ SOLN
12.5000 mg | INTRAMUSCULAR | Status: DC | PRN
  Administered 2015-02-05 – 2015-02-06 (×2): 12.5 mg via INTRAVENOUS
  Filled 2015-02-05 (×2): qty 1

## 2015-02-05 MED ORDER — BOOST / RESOURCE BREEZE PO LIQD
1.0000 | Freq: Three times a day (TID) | ORAL | Status: DC
  Administered 2015-02-05: 1 via ORAL

## 2015-02-05 NOTE — ED Notes (Signed)
Pt is here with cough times one month and states she is coughing up green/yellow sputum. No fever.  Pt states hurts in neck and upper chest.  Pt states she is hurting in her pancreas area too.  Pt has been diagnosed with acute pancreatitis.

## 2015-02-05 NOTE — Progress Notes (Signed)
Pt admitted to the unit at 1630. Pt mental status is A&Ox4. Pt oriented to room, staff, and call bell. Skin is intact. Full assessment charted in [facility]. Call bell within reach. Visitor guidelines reviewed w/ pt and/or family.    
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
## 2015-02-05 NOTE — ED Notes (Addendum)
Called and notified patient's potassium 2.7. Orders received for magnesium and potassium IV.
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
## 2015-02-05 NOTE — Progress Notes (Signed)
Attempted to call ED for report. Was transferred 3 times, and still was not connected to the patient's nurse. Retried the ED main number, and it was busy. Awaiting report. Will retry later.

## 2015-02-06 ENCOUNTER — Observation Stay (HOSPITAL_COMMUNITY): Payer: Medicaid Other

## 2015-02-06 DIAGNOSIS — K861 Other chronic pancreatitis: Secondary | ICD-10-CM | POA: Diagnosis not present

## 2015-02-06 DIAGNOSIS — J189 Pneumonia, unspecified organism: Secondary | ICD-10-CM | POA: Diagnosis not present

## 2015-02-06 DIAGNOSIS — K859 Acute pancreatitis, unspecified: Secondary | ICD-10-CM | POA: Diagnosis not present

## 2015-02-06 DIAGNOSIS — E785 Hyperlipidemia, unspecified: Secondary | ICD-10-CM | POA: Diagnosis present

## 2015-02-06 DIAGNOSIS — Z720 Tobacco use: Secondary | ICD-10-CM | POA: Diagnosis present

## 2015-02-06 DIAGNOSIS — J329 Chronic sinusitis, unspecified: Secondary | ICD-10-CM | POA: Diagnosis not present

## 2015-02-06 DIAGNOSIS — E538 Deficiency of other specified B group vitamins: Secondary | ICD-10-CM | POA: Diagnosis present

## 2015-02-06 DIAGNOSIS — K219 Gastro-esophageal reflux disease without esophagitis: Secondary | ICD-10-CM | POA: Diagnosis present

## 2015-02-06 DIAGNOSIS — R7303 Prediabetes: Secondary | ICD-10-CM | POA: Diagnosis present

## 2015-02-06 LAB — BASIC METABOLIC PANEL
ANION GAP: 6 (ref 5–15)
CHLORIDE: 109 mmol/L (ref 101–111)
CO2: 23 mmol/L (ref 22–32)
Calcium: 7.9 mg/dL — ABNORMAL LOW (ref 8.9–10.3)
Creatinine, Ser: 0.82 mg/dL (ref 0.44–1.00)
Glucose, Bld: 92 mg/dL (ref 65–99)
POTASSIUM: 3.6 mmol/L (ref 3.5–5.1)
Sodium: 138 mmol/L (ref 135–145)

## 2015-02-06 LAB — CBC
HCT: 31.2 % — ABNORMAL LOW (ref 36.0–46.0)
Hemoglobin: 10.6 g/dL — ABNORMAL LOW (ref 12.0–15.0)
MCH: 27.7 pg (ref 26.0–34.0)
MCHC: 34 g/dL (ref 30.0–36.0)
MCV: 81.5 fL (ref 78.0–100.0)
Platelets: 283 10*3/uL (ref 150–400)
RBC: 3.83 MIL/uL — ABNORMAL LOW (ref 3.87–5.11)
RDW: 15 % (ref 11.5–15.5)
WBC: 7.4 10*3/uL (ref 4.0–10.5)

## 2015-02-06 LAB — VITAMIN B12: Vitamin B-12: 501 pg/mL (ref 180–914)

## 2015-02-06 LAB — FERRITIN: Ferritin: 26 ng/mL (ref 11–307)

## 2015-02-06 LAB — EXPECTORATED SPUTUM ASSESSMENT W GRAM STAIN, RFLX TO RESP C: Special Requests: NORMAL

## 2015-02-06 LAB — IRON AND TIBC
IRON: 63 ug/dL (ref 28–170)
Saturation Ratios: 20 % (ref 10.4–31.8)
TIBC: 309 ug/dL (ref 250–450)
UIBC: 246 ug/dL

## 2015-02-06 LAB — HEMOGLOBIN A1C
Hgb A1c MFr Bld: 5.9 % — ABNORMAL HIGH (ref 4.8–5.6)
Mean Plasma Glucose: 123 mg/dL

## 2015-02-06 LAB — FOLATE: Folate: 4.6 ng/mL — ABNORMAL LOW (ref >5.9)

## 2015-02-06 MED ORDER — LORATADINE 10 MG PO TABS: 10.0000 mg | ORAL_TABLET | Freq: Every day | ORAL | Status: AC

## 2015-02-06 MED ORDER — PANTOPRAZOLE SODIUM 40 MG PO TBEC
40.0000 mg | DELAYED_RELEASE_TABLET | Freq: Every day | ORAL | Status: DC
  Administered 2015-02-06: 40 mg via ORAL
  Filled 2015-02-06: qty 1

## 2015-02-06 MED ORDER — FOLIC ACID 1 MG PO TABS: 1.0000 mg | ORAL_TABLET | Freq: Every day | ORAL | Status: AC

## 2015-02-06 MED ORDER — FLUTICASONE PROPIONATE 50 MCG/ACT NA SUSP: 2.0000 | Freq: Every day | NASAL | Status: AC

## 2015-02-06 MED ORDER — IBUPROFEN 200 MG PO TABS: 400.0000 mg | ORAL_TABLET | Freq: Four times a day (QID) | ORAL | Status: DC | PRN

## 2015-02-06 MED ORDER — PSEUDOEPHEDRINE HCL ER 120 MG PO TB12
120.0000 mg | ORAL_TABLET | Freq: Two times a day (BID) | ORAL | Status: DC
  Administered 2015-02-06: 120 mg via ORAL
  Filled 2015-02-06 (×2): qty 1

## 2015-02-06 MED ORDER — DOXYCYCLINE HYCLATE 100 MG PO TABS: 100.0000 mg | ORAL_TABLET | Freq: Two times a day (BID) | ORAL | Status: DC

## 2015-02-06 MED ORDER — BENZONATATE 100 MG PO CAPS: 100.0000 mg | ORAL_CAPSULE | Freq: Two times a day (BID) | ORAL | Status: DC

## 2015-02-06 MED ORDER — FOLIC ACID 1 MG PO TABS
1.0000 mg | ORAL_TABLET | Freq: Every day | ORAL | Status: DC
  Administered 2015-02-06: 1 mg via ORAL
  Filled 2015-02-06: qty 1

## 2015-02-06 MED ORDER — PANTOPRAZOLE SODIUM 40 MG PO TBEC: 40.0000 mg | DELAYED_RELEASE_TABLET | Freq: Every day | ORAL | Status: AC

## 2015-02-06 MED ORDER — DOXYCYCLINE HYCLATE 100 MG PO TABS
100.0000 mg | ORAL_TABLET | Freq: Two times a day (BID) | ORAL | Status: DC
  Administered 2015-02-06: 100 mg via ORAL
  Filled 2015-02-06 (×2): qty 1

## 2015-02-06 MED ORDER — PSEUDOEPHEDRINE HCL ER 120 MG PO TB12: 120.0000 mg | ORAL_TABLET | Freq: Two times a day (BID) | ORAL | Status: DC

## 2015-02-06 NOTE — Progress Notes (Signed)
Subjective:    No acute events overnight. Patient feels better compared to last night. She is tolerating a liquid diet with no nausea/vomiting. She is still coughing and feels congested. No shortness of breath or light-headedness. She denies any current chest or epigastric pain.    Objective:    Vital Signs:   Temp:  [97.8 F (36.6 C)-98.4 F (36.9 C)] 97.8 F (36.6 C) (06/24 0508) Pulse Rate:  [60-93] 60 (06/24 0508) Resp:  [12-24] 18 (06/24 0508) BP: (92-123)/(53-77) 102/58 mmHg (06/24 0650) SpO2:  [96 %-100 %] 100 % (06/24 0508) Weight:  [82.101 kg (181 lb)] 82.101 kg (181 lb) (06/24 0508) Last BM Date: 02/05/15  Intake/Output:   Intake/Output Summary (Last 24 hours) at 02/06/15 1106 Last data filed at 02/06/15 0913  Gross per 24 hour  Intake   2052 ml  Output      0 ml  Net   2052 ml      Physical Exam Constitutional: Sitting up in bed in no acute distress. HEENT: Normocephalic, atraumatic.Conjunctiva non-injected, sclera anicteric. No nasal drainage. Nasal turbinates are mildly pale and edematous. Moist mucous membranes, no exudate or erythema.  Cardiovascular: Regular rate and rhythm, normal S1,S2. No murmurs/rubs/gallops Pulmonary/Chest: Normal work of breathing on room air. Coughing on exam. Scattered rhonchi, improved from prior exam. No wheeze or crackles. Abdominal: Soft, non-distended,non-tender. Bowel sounds are normal. No rebound, no guarding. Extremities: Warm, well perfused. No lower extremity swelling. Neurological: Alert, appropriate, face symmetric, moves all extremities spontaneously, no gross focal deficits.  Labs:  Basic Metabolic Panel:  Recent Labs Lab 02/05/15 1202 02/05/15 1710 02/05/15 1851 02/06/15 0555  NA 139  --  137 138  K 2.7*  --  2.5* 3.6  CL 105  --  103 109  CO2 27  --  26 23  GLUCOSE 106*  --  119* 92  BUN <5*  --  <5* <5*  CREATININE 0.92  --  0.82 0.82  CALCIUM 9.3  --  8.6* 7.9*  MG  --  2.3  --   --   PHOS  --   3.9  --   --     Liver Function Tests:  Recent Labs Lab 02/05/15 1202  AST 28  ALT 23  ALKPHOS 67  BILITOT 0.6  PROT 7.7  ALBUMIN 3.6    Recent Labs Lab 02/05/15 1202  LIPASE 43    CBC:  Recent Labs Lab 02/05/15 1202 02/06/15 0555  WBC 6.9 7.4  NEUTROABS 3.4  --   HGB 11.8* 10.6*  HCT 34.6* 31.2*  MCV 80.8 81.5  PLT 323 283    Microbiology: Results for orders placed or performed during the hospital encounter of 02/05/15  MRSA PCR Screening     Status: None   Collection Time: 02/05/15  4:56 PM  Result Value Ref Range Status   MRSA by PCR NEGATIVE NEGATIVE Final    Comment:        The GeneXpert MRSA Assay (FDA approved for NASAL specimens only), is one component of a comprehensive MRSA colonization surveillance program. It is not intended to diagnose MRSA infection nor to guide or monitor treatment for MRSA infections.     Coagulation Studies:  Recent Labs  02/05/15 1710  LABPROT 14.6  INR 1.12     Other results: EKG: Repeat EKG 6/24: Sinus rhythm, mild bradycardia with HR 59. No ST changes from baseline.  Imaging: Dg Chest 2 View  02/06/2015   CLINICAL DATA:  Cough and pneumonia  EXAM: CHEST - 2 VIEW  COMPARISON:  02/05/2015  FINDINGS: Cardiac shadow is within normal limits. The lungs are well aerated bilaterally. Persistent bibasilar changes are noted. No focal confluent infiltrate is seen. No sizable effusion is noted.  IMPRESSION: Mild bibasilar atelectatic changes   Electronically Signed   By: Alcide Clever M.D.   On: 02/06/2015 08:14   Dg Chest 2 View  02/05/2015   CLINICAL DATA:  Acute pancreatitis.  Cough and fever.  Chest pain.  EXAM: CHEST - 2 VIEW  COMPARISON:  Two-view chest x-ray 06/26/2014  FINDINGS: The heart size is normal. Mild ill-defined airspace disease at the left base is new. The lungs are otherwise clear. Chronic scarring or atelectasis at the right base is stable. The upper lung fields are clear. The visualized soft tissues  and bony thorax are unremarkable.  IMPRESSION: 1. New ill-defined left lower lobe airspace disease concerning for pneumonia. 2. Stable chronic atelectasis or scarring at the right base.   Electronically Signed   By: Marin Roberts M.D.   On: 02/05/2015 13:15       Medications:    Infusions: None    Scheduled Medications: . benzonatate  100 mg Oral BID  . doxycycline  100 mg Oral Q12H  . enoxaparin (LOVENOX) injection  40 mg Subcutaneous Q24H  . feeding supplement (RESOURCE BREEZE)  1 Container Oral TID BM  . fluticasone  2 spray Each Nare Daily  . folic acid  1 mg Oral Daily  . loratadine  10 mg Oral Daily  . pantoprazole  40 mg Oral Daily  . pseudoephedrine  120 mg Oral BID    PRN Medications: acetaminophen, albuterol, ibuprofen, promethazine   Assessment/ Plan:    44 year old woman with history of chronic pancreatitis who presents with productive cough, sinus congestion, and malaise, initial concern for pneumonia with new LLL opacity on chest xray but now more consistent with sinusitis.  Principal Problem:   Sinusitis Active Problems:   Hypokalemia   Chronic pancreatitis   Chest pain   Tobacco use   Folate deficiency   Hyperlipidemia   Sinusitis: History is most consistent with sinusitis and laryngitis. Less likely to be pneumonia given procalcitonin<0.10, no leukocytosis or other signs of sepsis, repeat chest xray with mild atelectatic , negative strep pneumo urinary antigen. GERD and seasonal allergies may also be contributing to patient's cough and congestion, respectively.  -Discontinue ceftriaxone and azithromycin, as community-acquired pneumonia is unlikely. -Doxycycline 100 mg BID for 7 days for likely bacterial sinusitis -Sudafed 120 mg BID x 7 days  -Tessalon capsules 100 mg BID for cough -Albuterol nebs q4hrs PRN wheezing and shortness of breath -Acetaminophen 650 mg q4hrs PRN pain or fever -Flonase 2 sprays daily  -Follow up sputum culture -Follow  up urine legionella  Vomiting and epigastric pain in setting of chronic pancreatitis: Vomiting and epigastric pain improved after receiving IV fluids. Lipase 43 on admission. Pt is tolerating a liquid diet. Lipid panel with LDL 151, HDL 32, triglycerides 251. -Recommend pancreatitis diet -Phenergan 12.5 mg q4hrs PRN nausea/vomiting -Advance to soft diet -Tylenol PRN pain  -GERD management as below -Needs to establish outpatient care for pancreatitis management  Chest pain, possible GERD: History consistent with MSK and/or GERD-induced chest pain. Pt describes significant GERD symptoms and minimal relief from tums and milk. Unlikely to be cardiac etiology with negative troponin and two ECGs with no changes from baseline. -Protonix 40 mg daily -Acetaminophen 650 mg q4hrs PRN pain  Hypokalemia: Potassium 2.7 on admission,  likely secondary to frequent vomiting. Now normal at 3.6 after repletion -Monitor and replete PRN  Hyperglycemia: Mild hyperglycemia on past chemistry panels and no current PCP.  -Follow up HbA1c  Seasonal allergies: Pt reports seasonal allergies that contribute to her nasal congestion. -Loratadine 10 mg daily -Flonase 2 sprays per nare daily  Diet: Soft diet DVT PPx: Lovenox Code: Full   Dispo: Discharge anticipated today.  SERVICE NEEDED AT DISCHARGE - TO BE DETERMINED DURING HOSPITAL COURSE         Y = Yes, Blank = No PT:   OT:   RN:   Equipment:   Other:      Length of Stay: 1 day(s)   Signed: Fausto Skillern, MS4
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
## 2015-02-06 NOTE — Progress Notes (Signed)
Pt had critical potassium of 2.5 reported by Princella Pellegrini from lab. Talked to on-call l internal medicine teaching service Dr. Danella Penton which stated that K replacement via IV was already ordered.

## 2015-02-06 NOTE — Progress Notes (Signed)
Reviewed discharge paperwork with pt.  Pt denied any needs at this time.  PIVs removed.  Pt taken to discharge location via wheelchair.

## 2015-02-06 NOTE — Discharge Instructions (Signed)
Low-Fat Diet for Pancreatitis or Gallbladder Conditions °A low-fat diet can be helpful if you have pancreatitis or a gallbladder condition. With these conditions, your pancreas and gallbladder have trouble digesting fats. A healthy eating plan with less fat will help rest your pancreas and gallbladder and reduce your symptoms. °WHAT DO I NEED TO KNOW ABOUT THIS DIET? °· Eat a low-fat diet. °¨ Reduce your fat intake to less than 20-30% of your total daily calories. This is less than 50-60 g of fat per day. °¨ Remember that you need some fat in your diet. Ask your dietician what your daily goal should be. °¨ Choose nonfat and low-fat healthy foods. Look for the words "nonfat," "low fat," or "fat free." °¨ As a guide, look on the label and choose foods with less than 3 g of fat per serving. Eat only one serving. °· Avoid alcohol. °· Do not smoke. If you need help quitting, talk with your health care provider. °· Eat small frequent meals instead of three large heavy meals. °WHAT FOODS CAN I EAT? °Grains °Include healthy grains and starches such as potatoes, wheat bread, fiber-rich cereal, and brown rice. Choose whole grain options whenever possible. In adults, whole grains should account for 45-65% of your daily calories.  °Fruits and Vegetables °Eat plenty of fruits and vegetables. Fresh fruits and vegetables add fiber to your diet. °Meats and Other Protein Sources °Eat lean meat such as chicken and pork. Trim any fat off of meat before cooking it. Eggs, fish, and beans are other sources of protein. In adults, these foods should account for 10-35% of your daily calories. °Dairy °Choose low-fat milk and dairy options. Dairy includes fat and protein, as well as calcium.  °Fats and Oils °Limit high-fat foods such as fried foods, sweets, baked goods, sugary drinks.  °Other °Creamy sauces and condiments, such as mayonnaise, can add extra fat. Think about whether or not you need to use them, or use smaller amounts or low fat  options. °WHAT FOODS ARE NOT RECOMMENDED? °· High fat foods, such as: °¨ Baked goods. °¨ Ice cream. °¨ French toast. °¨ Sweet rolls. °¨ Pizza. °¨ Cheese bread. °¨ Foods covered with batter, butter, creamy sauces, or cheese. °¨ Fried foods. °¨ Sugary drinks and desserts. °· Foods that cause gas or bloating °Document Released: 08/06/2013 Document Reviewed: 08/06/2013 °[employer]® Patient Information ©2015 [employer]. This information is not intended to replace advice given to you by your health care provider. Make sure you discuss any questions you have with your health care provider. ° °

## 2015-02-06 NOTE — Progress Notes (Signed)
Subjective: No acute events overnight. Feels better. No new complaints.  Objective: Vital signs in last 24 hours: Filed Vitals:   02/05/15 1638 02/05/15 2111 02/06/15 0508 02/06/15 0650  BP: 95/57 112/56 92/53 102/58  Pulse: 65 70 60   Temp: 98 F (36.7 C) 98.4 F (36.9 C) 97.8 F (36.6 C)   TempSrc: Oral Oral Oral   Resp: 16 18 18    Height: 5\' 1"  (1.549 m)     Weight: 181 lb (82.101 kg)  181 lb (82.101 kg)   SpO2: 100% 100% 100%    Weight change:   Intake/Output Summary (Last 24 hours) at 02/06/15 1114 Last data filed at 02/06/15 0913  Gross per 24 hour  Intake   2052 ml  Output      0 ml  Net   2052 ml   General Apperance: NAD HEENT: Normocephalic, atraumatic, PERRL, EOMI, anicteric sclera Neck: Supple, trachea midline Lungs: Few rhonchi. No wheezes, rhonchi or rales. Breathing comfortably on room air. Heart: Regular rate and rhythm, no murmur/rub/gallop Abdomen: Soft, nontender, nondistended, no rebound/guarding Extremities: Normal, atraumatic, warm and well perfused, no edema Pulses: 2+ throughout Skin: No rashes or lesions Neurologic: Alert and oriented x 3. CNII-XII intact. Normal strength and sensation  Lab Results: Basic Metabolic Panel:  Recent Labs Lab 02/05/15 1710 02/05/15 1851 02/06/15 0555  NA  --  137 138  K  --  2.5* 3.6  CL  --  103 109  CO2  --  26 23  GLUCOSE  --  119* 92  BUN  --  <5* <5*  CREATININE  --  0.82 0.82  CALCIUM  --  8.6* 7.9*  MG 2.3  --   --   PHOS 3.9  --   --    Liver Function Tests:  Recent Labs Lab 02/05/15 1202  AST 28  ALT 23  ALKPHOS 67  BILITOT 0.6  PROT 7.7  ALBUMIN 3.6    Recent Labs Lab 02/05/15 1202  LIPASE 43   CBC:  Recent Labs Lab 02/05/15 1202 02/06/15 0555  WBC 6.9 7.4  NEUTROABS 3.4  --   HGB 11.8* 10.6*  HCT 34.6* 31.2*  MCV 80.8 81.5  PLT 323 283   Hemoglobin A1C:  Recent Labs Lab 02/05/15 1710  HGBA1C 5.9*   Fasting Lipid Panel:  Recent Labs Lab 02/05/15 1718    CHOL 233*  HDL 32*  LDLCALC 151*  TRIG 251*  CHOLHDL 7.3   Coagulation:  Recent Labs Lab 02/05/15 1710  LABPROT 14.6  INR 1.12   Anemia Panel:  Recent Labs Lab 02/06/15 0555  VITAMINB12 501  FOLATE 4.6*  FERRITIN 26  TIBC 309  IRON 63   Alcohol Level:  Recent Labs Lab 02/05/15 1710  ETH <5   Urinalysis:  Recent Labs Lab 02/05/15 1918  COLORURINE YELLOW  LABSPEC 1.008  PHURINE 6.0  GLUCOSEU NEGATIVE  HGBUR TRACE*  BILIRUBINUR NEGATIVE  KETONESUR NEGATIVE  PROTEINUR NEGATIVE  UROBILINOGEN 0.2  NITRITE POSITIVE*  LEUKOCYTESUR NEGATIVE   Micro Results: Recent Results (from the past 240 hour(s))  MRSA PCR Screening     Status: None   Collection Time: 02/05/15  4:56 PM  Result Value Ref Range Status   MRSA by PCR NEGATIVE NEGATIVE Final    Comment:        The GeneXpert MRSA Assay (FDA approved for NASAL specimens only), is one component of a comprehensive MRSA colonization surveillance program. It is not intended to diagnose MRSA infection nor  to guide or monitor treatment for MRSA infections.    Studies/Results: Dg Chest 2 View  02/06/2015   CLINICAL DATA:  Cough and pneumonia  EXAM: CHEST - 2 VIEW  COMPARISON:  02/05/2015  FINDINGS: Cardiac shadow is within normal limits. The lungs are well aerated bilaterally. Persistent bibasilar changes are noted. No focal confluent infiltrate is seen. No sizable effusion is noted.  IMPRESSION: Mild bibasilar atelectatic changes   Electronically Signed   By: Alcide Clever M.D.   On: 02/06/2015 08:14   Dg Chest 2 View  02/05/2015   CLINICAL DATA:  Acute pancreatitis.  Cough and fever.  Chest pain.  EXAM: CHEST - 2 VIEW  COMPARISON:  Two-view chest x-ray 06/26/2014  FINDINGS: The heart size is normal. Mild ill-defined airspace disease at the left base is new. The lungs are otherwise clear. Chronic scarring or atelectasis at the right base is stable. The upper lung fields are clear. The visualized soft tissues and  bony thorax are unremarkable.  IMPRESSION: 1. New ill-defined left lower lobe airspace disease concerning for pneumonia. 2. Stable chronic atelectasis or scarring at the right base.   Electronically Signed   By: Marin Roberts M.D.   On: 02/05/2015 13:15   Medications: I have reviewed the patient's current medications. Scheduled Meds: . benzonatate  100 mg Oral BID  . doxycycline  100 mg Oral Q12H  . enoxaparin (LOVENOX) injection  40 mg Subcutaneous Q24H  . feeding supplement (RESOURCE BREEZE)  1 Container Oral TID BM  . fluticasone  2 spray Each Nare Daily  . folic acid  1 mg Oral Daily  . loratadine  10 mg Oral Daily  . pantoprazole  40 mg Oral Daily  . pseudoephedrine  120 mg Oral BID   Continuous Infusions:  PRN Meds:.acetaminophen, albuterol, ibuprofen, promethazine Assessment/Plan: Principal Problem:   Sinusitis Active Problems:   Hypokalemia   Chronic pancreatitis   Chest pain   Tobacco use   Folate deficiency   Hyperlipidemia  Sinusitis: CXR this morning with mild bibasilar atelectatic changes. Urine strep pneumo negative. -Urine legionella pending -d/c azithromycin and ceftriaxone -Sputum culture pending -Albuterol neb 5 mg every 4 hours when necessary -Tessalon 100 mg twice a day -Flonase 2 sprays daily -Doxycycline 100 mg twice a day to complete 7 days of antibiotics  -Sudafed 120 mg twice a day -May need referral to ENT and/or consideration of further imaging if no improvement  Acute on chronic pancreatitis: Lipase 43 on admission. Resolving. -Diet advance to soft diet -D/C IV fluids -Tylenol and ibuprofen when necessary pain -Phenergan 12.5 mg every 4 hours when necessary nausea/vomiting -Nutrition consult  Chest pain: Pain is reproducible on palpation making MSK etiology a possibility. Initial troponin POC 0. EKG without ischemic changes. CXR as discussed above. Likely 2/2 problem above. EKG unchanged. -Protonix 40 mg daily  Elevated glucose:  Glucose on admission 106. -Hgb A1c pending  Chronic Normocytic Anemia: Hemoglobin 11.8 on admission. MCV 80.8. Previous hemoglobin 11.8 on 06/26/2014. -Folic acid 1 mg daily -Continue to monitor  Seasonal allergies: -Loratadine 10 mg daily  Hyperlipidemia: Cholesterol 233, triglycerides 251, HDL 32, LDL 151. -Recommend lifestyle changes and will have patient follow up with a PCP  FEN: -Soft diet  VTE ppx: Lovenox Dispo: Likely home today.   The patient does not have a current PCP (No Pcp Per Patient) and does need an [facility] hospital follow-up appointment after discharge.  The patient does not know have transportation limitations that hinder transportation to clinic appointments.  Marland Kitchen  Services Needed at time of discharge: Y = Yes, Blank = No PT:   OT:   RN:   Equipment:   Other:     LOS: 1 day   Alyssa Paula, MD 02/06/2015, 11:14 AM

## 2015-02-06 NOTE — Progress Notes (Signed)
Nutrition Brief Note  Patient identified on the Malnutrition Screening Tool (MST) Report. Weight fairly stable. No significant weight loss identified.  Wt Readings from Last 15 Encounters:  02/06/15 181 lb (82.101 kg)  05/12/14 177 lb (80.287 kg)  05/21/13 172 lb (78.019 kg)    Body mass index is 34.22 kg/(m^2). Patient meets criteria for obesity, class 1 based on current BMI.   Current diet order is soft, patient is consuming approximately 50% of meals at this time. Labs and medications reviewed.   Patient requested education on a diet for her chronic pancreatitis. Provided "Pancreatitis Nutrition Therapy" and "Pancreatitis Label Reading Tips" handouts to patient. Discussed decreasing total amount of fat in her diet. She was very receptive.  No further nutrition interventions warranted at this time. If nutrition issues arise, please consult RD.    Joaquin Courts, RD, LDN, CNSC Pager [phone] After Hours Pager [phone]

## 2015-02-07 LAB — VITAMIN D 25 HYDROXY (VIT D DEFICIENCY, FRACTURES): VIT D 25 HYDROXY: 13.6 ng/mL — AB (ref 30.0–100.0)

## 2015-02-07 LAB — HIV ANTIBODY (ROUTINE TESTING W REFLEX): HIV Screen 4th Generation wRfx: NONREACTIVE

## 2015-02-08 LAB — CULTURE, RESPIRATORY: CULTURE: NORMAL

## 2015-02-08 LAB — CULTURE, RESPIRATORY W GRAM STAIN

## 2015-02-09 LAB — LEGIONELLA ANTIGEN, URINE

## 2015-02-12 ENCOUNTER — Telehealth: Payer: Self-pay | Admitting: Internal Medicine

## 2015-02-12 ENCOUNTER — Encounter: Payer: Self-pay | Admitting: Internal Medicine

## 2015-02-12 DIAGNOSIS — E559 Vitamin D deficiency, unspecified: Secondary | ICD-10-CM | POA: Insufficient documentation

## 2015-02-12 MED ORDER — VITAMIN D (ERGOCALCIFEROL) 1.25 MG (50000 UNIT) PO CAPS: 50000.0000 [IU] | ORAL_CAPSULE | ORAL | Status: AC

## 2015-02-12 NOTE — Telephone Encounter (Signed)
Call to patient to confirm appointment for 02/13/15 at 9:30 lmtcb

## 2015-02-12 NOTE — Telephone Encounter (Signed)
Called pt regarding her low vitamin D level of 13.6 that resulted after recent hospitalization. I left a voice message as there was no answer. I instructed her to start taking ergocalciferol 50K U weekly for 8 weeks with recheck of levels after completion of therapy. She has an appointment tomorrow with us in clinic.   Dr. Johna Rolesabbani

## 2015-02-13 ENCOUNTER — Encounter: Payer: Self-pay | Admitting: Internal Medicine

## 2015-02-13 ENCOUNTER — Ambulatory Visit (INDEPENDENT_AMBULATORY_CARE_PROVIDER_SITE_OTHER): Payer: Self-pay | Admitting: Internal Medicine

## 2015-02-13 VITALS — BP 125/70 | HR 67 | Temp 98.3°F | Ht 64.0 in | Wt 166.4 lb

## 2015-02-13 DIAGNOSIS — E785 Hyperlipidemia, unspecified: Secondary | ICD-10-CM

## 2015-02-13 DIAGNOSIS — J329 Chronic sinusitis, unspecified: Secondary | ICD-10-CM

## 2015-02-13 DIAGNOSIS — R7309 Other abnormal glucose: Secondary | ICD-10-CM

## 2015-02-13 DIAGNOSIS — J019 Acute sinusitis, unspecified: Secondary | ICD-10-CM

## 2015-02-13 DIAGNOSIS — K219 Gastro-esophageal reflux disease without esophagitis: Secondary | ICD-10-CM

## 2015-02-13 DIAGNOSIS — K861 Other chronic pancreatitis: Secondary | ICD-10-CM

## 2015-02-13 DIAGNOSIS — E559 Vitamin D deficiency, unspecified: Secondary | ICD-10-CM

## 2015-02-13 DIAGNOSIS — E538 Deficiency of other specified B group vitamins: Secondary | ICD-10-CM

## 2015-02-13 DIAGNOSIS — R7303 Prediabetes: Secondary | ICD-10-CM

## 2015-02-13 NOTE — Progress Notes (Deleted)
Patient ID: Alyssa Flowers, female   DOB: [birth year], 44 y.o.   MRN: [id number]

## 2015-02-13 NOTE — Assessment & Plan Note (Signed)
We discussed and offered her information on receiving financial assistance/insurance possibilities and once secure, can consider Creon treatment.

## 2015-02-13 NOTE — Assessment & Plan Note (Signed)
Continue Folic Acid 1 mg tablet by mouth daily

## 2015-02-13 NOTE — Patient Instructions (Signed)
Thank you for visiting us today.  Please visit [facility] to obtain the medications we talked about. Doxycycline 100 mg tablets for 14 days for about $8.00 (this is for sinusitis). Pantoprazole (PROTONIX) 40 mg, 30 count, for $15 (this is for acid reduction). [facility] also [facility].  Complete paperwork for insurance coverage.  Please schedule a follow-up visit 3 weeks from now after completion of antibiotics course.  Bring medications on follow up visit.

## 2015-02-13 NOTE — Assessment & Plan Note (Signed)
Discussed as above with diet and exercise regimen.

## 2015-02-13 NOTE — Assessment & Plan Note (Signed)
We discussed the importance of completing an antibiotic regimen and she agreed to a course of Doxycycline 100 mg for 14 days which she can find an affordable price at [facility]. We will follow up with her after completion of abx course in 3 weeks.

## 2015-02-13 NOTE — Assessment & Plan Note (Signed)
Continue Vitamin D, 50,000 Units by mouth every 7 days.

## 2015-02-13 NOTE — Assessment & Plan Note (Signed)
We discussed cheaper alternatives to pantoprazole and she has agreed to take Omeprazole which she can also find at [facility].
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
## 2015-02-13 NOTE — Assessment & Plan Note (Signed)
We discussed the importance of her glucose and Hgb A1C levels and she understands the necessary dietary and exercise changes needed to be made. She has received information on pancreatic and diabetic dietary regimens and has agreed to work on making such changes as well as to continue exercising.

## 2015-02-17 NOTE — Progress Notes (Signed)
Internal Medicine Clinic Attending  I saw and evaluated the patient.  I personally confirmed the key portions of the history and exam documented by Dr. Patel,Vishal and I reviewed pertinent patient test results.  The assessment, diagnosis, and plan were formulated together and I agree with the documentation in the resident's note.  

## 2015-03-05 ENCOUNTER — Telehealth: Payer: Self-pay | Admitting: Internal Medicine

## 2015-03-05 NOTE — Telephone Encounter (Signed)
Calling patient to confirm appointment for 03/06/15 at 9:15 phone is disconnected

## 2015-03-06 ENCOUNTER — Ambulatory Visit (INDEPENDENT_AMBULATORY_CARE_PROVIDER_SITE_OTHER): Payer: Self-pay | Admitting: Internal Medicine

## 2015-03-06 ENCOUNTER — Encounter: Payer: Self-pay | Admitting: Internal Medicine

## 2015-03-06 VITALS — BP 138/77 | HR 65 | Temp 97.8°F | Ht 64.0 in | Wt 170.5 lb

## 2015-03-06 DIAGNOSIS — K861 Other chronic pancreatitis: Secondary | ICD-10-CM

## 2015-03-06 DIAGNOSIS — J019 Acute sinusitis, unspecified: Secondary | ICD-10-CM

## 2015-03-06 DIAGNOSIS — E559 Vitamin D deficiency, unspecified: Secondary | ICD-10-CM

## 2015-03-06 DIAGNOSIS — K219 Gastro-esophageal reflux disease without esophagitis: Secondary | ICD-10-CM

## 2015-03-06 NOTE — Assessment & Plan Note (Signed)
Patient states that when she eats fatty/fried food, she has episodes of fatty emesis and steatorrhea. She knows which foods cause this and is trying to adjust her diet. Patient states that she is speaking with Jaynee Eagles today for help with financial assistance/insurance as well as applying for jobs which provide insurance. We discussed that once this situation is resolved, we can try Creon treatment.  -Consider Creon treatment once financial situation is resolved. -Continue with dietary adjustments, patient given information on pancreatic diet.

## 2015-03-06 NOTE — Patient Instructions (Signed)
Thank you for your visit.  We are checking your Vitamin D levels today.  For your pancreas, please try to avoid the offending foods which are giving you issues. Once your insurance/financial situation is stable we can try to add a medication which is a replacement for the pancreatic enzymes.  Low-Fat Diet for Pancreatitis or Gallbladder Conditions A low-fat diet can be helpful if you have pancreatitis or a gallbladder condition. With these conditions, your pancreas and gallbladder have trouble digesting fats. A healthy eating plan with less fat will help rest your pancreas and gallbladder and reduce your symptoms. WHAT DO I NEED TO KNOW ABOUT THIS DIET?  Eat a low-fat diet.  Reduce your fat intake to less than 20-30% of your total daily calories. This is less than 50-60 g of fat per day.  Remember that you need some fat in your diet. Ask your dietician what your daily goal should be.  Choose nonfat and low-fat healthy foods. Look for the words "nonfat," "low fat," or "fat free."  As a guide, look on the label and choose foods with less than 3 g of fat per serving. Eat only one serving.  Avoid alcohol.  Do not smoke. If you need help quitting, talk with your health care provider.  Eat small frequent meals instead of three large heavy meals. WHAT FOODS CAN I EAT? Grains Include healthy grains and starches such as potatoes, wheat bread, fiber-rich cereal, and brown rice. Choose whole grain options whenever possible. In adults, whole grains should account for 45-65% of your daily calories.  Fruits and Vegetables Eat plenty of fruits and vegetables. Fresh fruits and vegetables add fiber to your diet. Meats and Other Protein Sources Eat lean meat such as chicken and pork. Trim any fat off of meat before cooking it. Eggs, fish, and beans are other sources of protein. In adults, these foods should account for 10-35% of your daily calories. Dairy Choose low-fat milk and dairy options. Dairy  includes fat and protein, as well as calcium.  Fats and Oils Limit high-fat foods such as fried foods, sweets, baked goods, sugary drinks.  Other Creamy sauces and condiments, such as mayonnaise, can add extra fat. Think about whether or not you need to use them, or use smaller amounts or low fat options. WHAT FOODS ARE NOT RECOMMENDED?  High fat foods, such as:  [employer].  Ice cream.  [place] toast.  Sweet rolls.  Pizza.  Cheese bread.  Foods covered with batter, butter, creamy sauces, or cheese.  Fried foods.  Sugary drinks and desserts.  Foods that cause gas or bloating Document Released: 08/06/2013 Document Reviewed: 08/06/2013 [facility] Patient Information 2015 [place], [place]. This information is not intended to replace advice given to you by your health care provider. Make sure you discuss any questions you have with your health care provider.

## 2015-03-06 NOTE — Assessment & Plan Note (Signed)
Symptoms resolved after course of Doxycycline 100 mg for 14 days.

## 2015-03-06 NOTE — Progress Notes (Signed)
Medicine attending: I personally interviewed and briefly examined this patient, and reviewed pertinent clinical laboratory and radiographic data  with resident physician Dr.Vishal Patel and we discussed a  management plan.  
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
## 2015-03-06 NOTE — Assessment & Plan Note (Signed)
Patient is taking Prilosec for her acid reflux. She states that it is helpful in reducing her symptoms of heartburn.  -Continue Prilosec.

## 2015-03-06 NOTE — Assessment & Plan Note (Addendum)
Vitamin D level on 02/06/2015 was 13.6. She was started on Vitamin D 50,000 Units PO once weekly, which she is taking.  -Recheck Vitamin D today -Continue Vitamin D 50,000 Units PO once every 7 days.

## 2015-03-07 LAB — VITAMIN D 25 HYDROXY (VIT D DEFICIENCY, FRACTURES): VIT D 25 HYDROXY: 19 ng/mL — AB (ref 30–100)

## 2016-12-01 IMAGING — DX DG CHEST 2V
2 series · 2 of 2 positions shown · non-contrast
Comparison: Two-view chest x-ray 06/26/2014

CLINICAL DATA: Acute pancreatitis.  Cough and fever.  Chest pain.

EXAM:
CHEST - 2 VIEW

[chest pa]
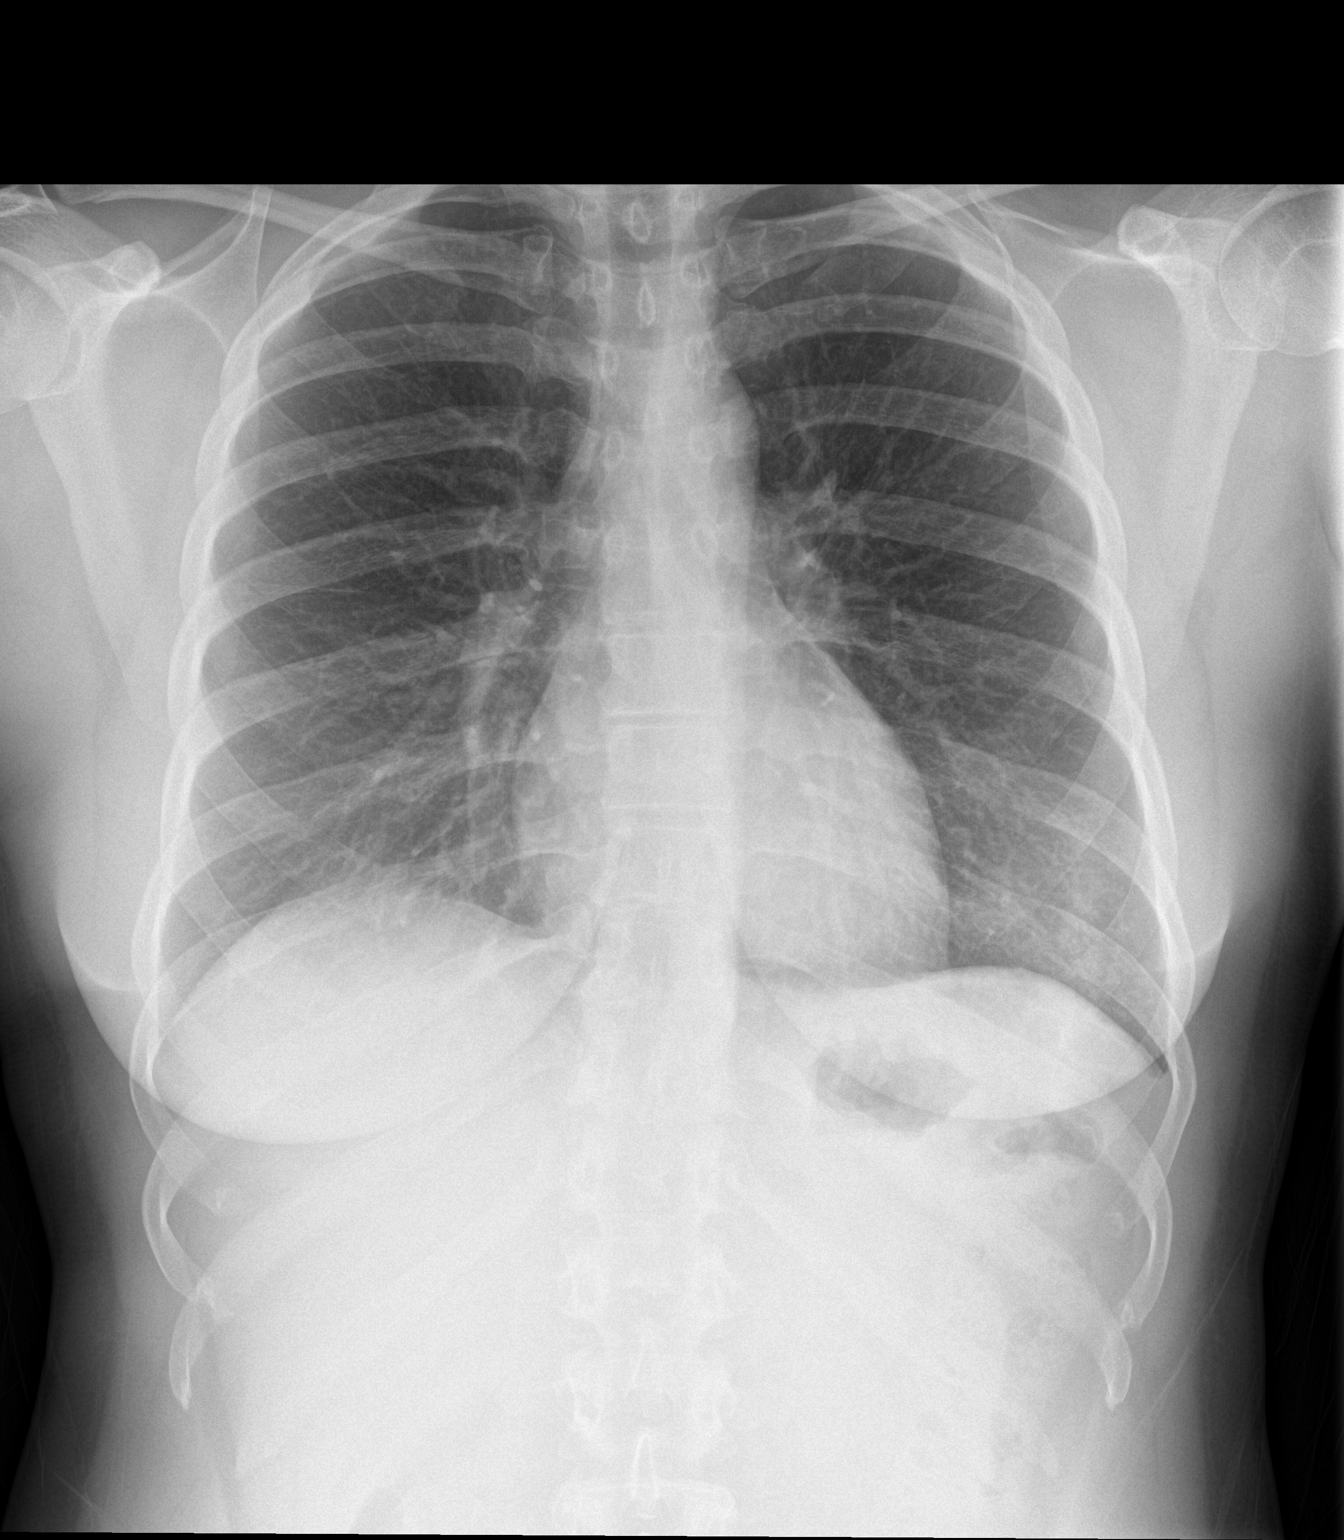

[chest lat]
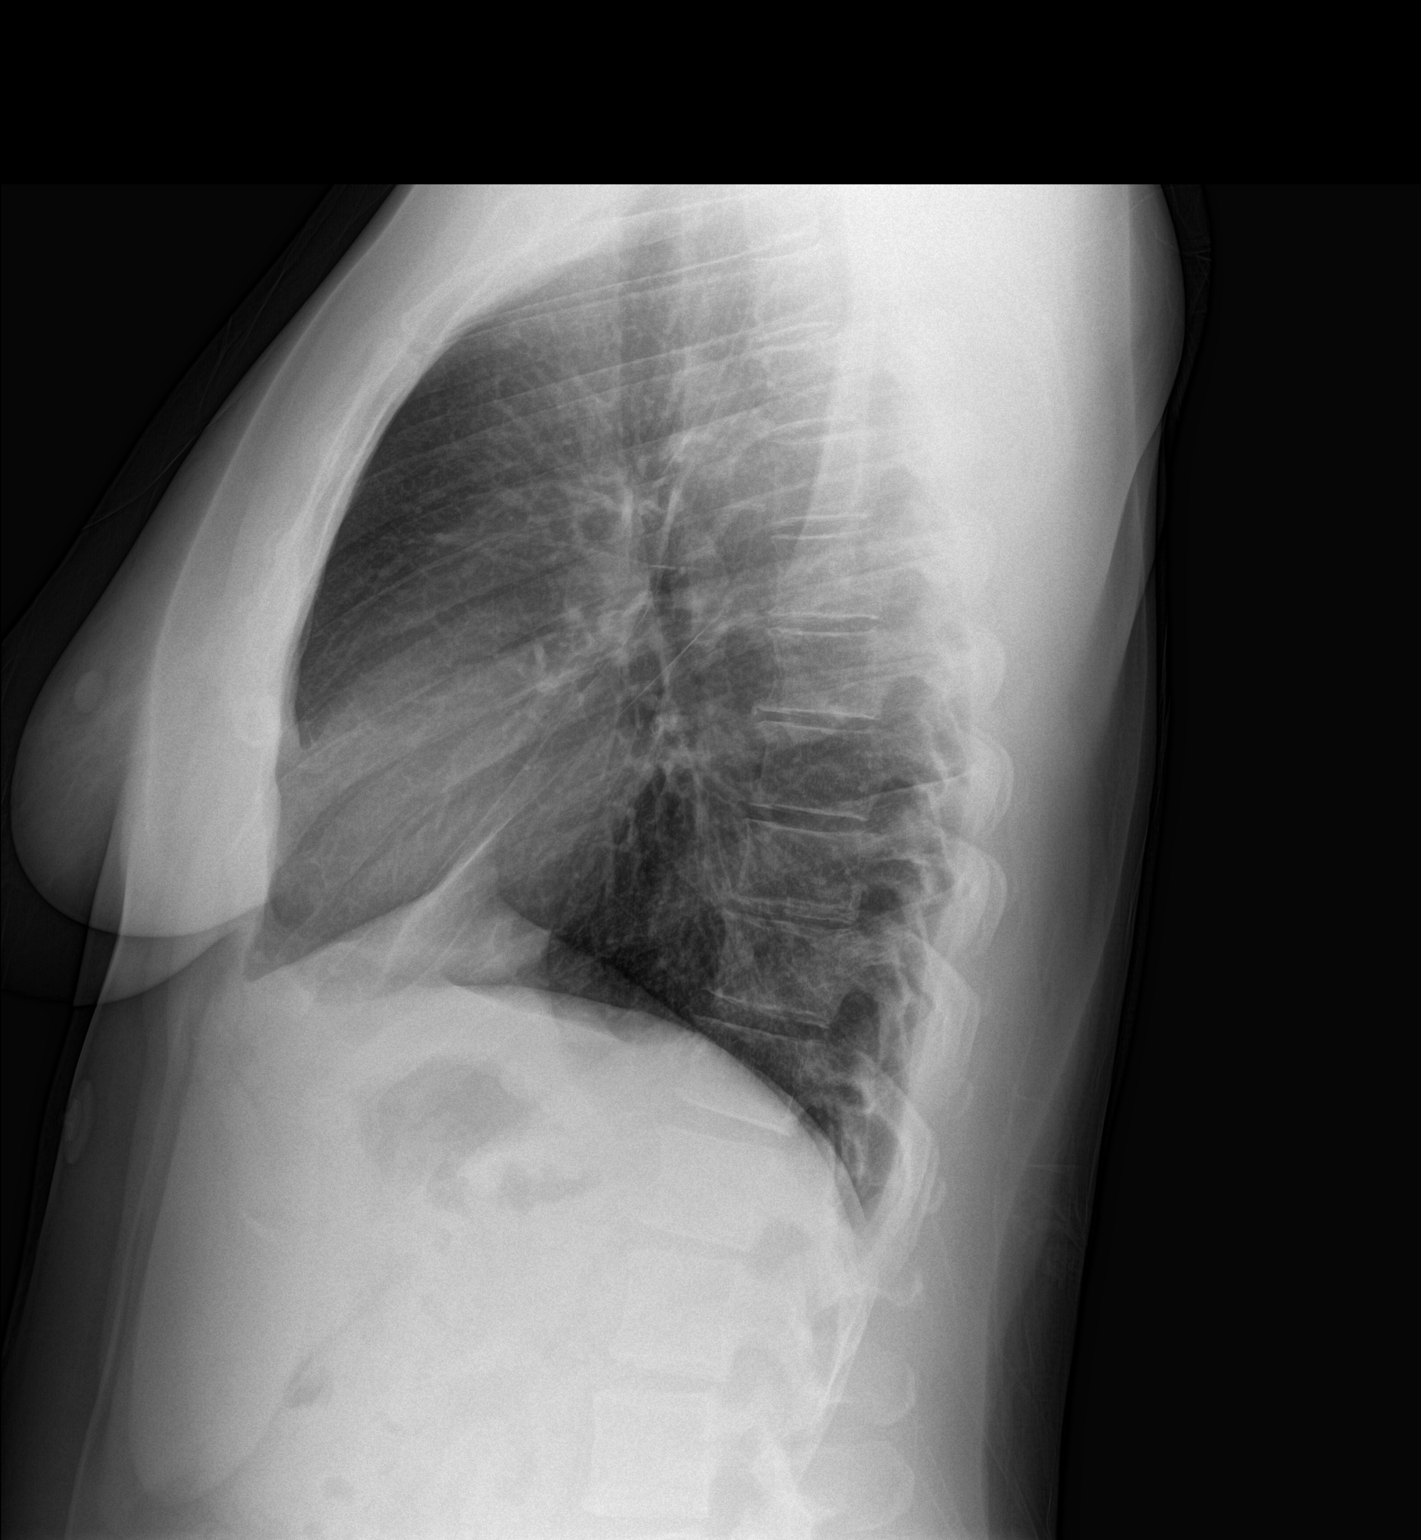

[2 of 2 positions shown; findings below may reference images not displayed]

FINDINGS: The heart size is normal. Mild ill-defined airspace disease at the
left base is new. The lungs are otherwise clear. Chronic scarring or
atelectasis at the right base is stable. The upper lung fields are
clear. The visualized soft tissues and bony thorax are unremarkable.
IMPRESSION: 1. New ill-defined left lower lobe airspace disease concerning for
pneumonia.
2. Stable chronic atelectasis or scarring at the right base.

## 2020-02-13 DIAGNOSIS — Z419 Encounter for procedure for purposes other than remedying health state, unspecified: Secondary | ICD-10-CM | POA: Diagnosis not present

## 2020-03-15 DIAGNOSIS — Z419 Encounter for procedure for purposes other than remedying health state, unspecified: Secondary | ICD-10-CM | POA: Diagnosis not present

## 2020-04-15 DIAGNOSIS — Z419 Encounter for procedure for purposes other than remedying health state, unspecified: Secondary | ICD-10-CM | POA: Diagnosis not present

## 2020-05-15 DIAGNOSIS — Z419 Encounter for procedure for purposes other than remedying health state, unspecified: Secondary | ICD-10-CM | POA: Diagnosis not present

## 2020-06-15 DIAGNOSIS — Z419 Encounter for procedure for purposes other than remedying health state, unspecified: Secondary | ICD-10-CM | POA: Diagnosis not present

## 2020-07-15 DIAGNOSIS — Z419 Encounter for procedure for purposes other than remedying health state, unspecified: Secondary | ICD-10-CM | POA: Diagnosis not present

## 2020-08-15 DIAGNOSIS — Z419 Encounter for procedure for purposes other than remedying health state, unspecified: Secondary | ICD-10-CM | POA: Diagnosis not present

## 2020-09-15 DIAGNOSIS — Z419 Encounter for procedure for purposes other than remedying health state, unspecified: Secondary | ICD-10-CM | POA: Diagnosis not present

## 2020-10-13 DIAGNOSIS — Z419 Encounter for procedure for purposes other than remedying health state, unspecified: Secondary | ICD-10-CM | POA: Diagnosis not present

## 2020-11-13 DIAGNOSIS — Z419 Encounter for procedure for purposes other than remedying health state, unspecified: Secondary | ICD-10-CM | POA: Diagnosis not present

## 2020-12-13 DIAGNOSIS — Z419 Encounter for procedure for purposes other than remedying health state, unspecified: Secondary | ICD-10-CM | POA: Diagnosis not present

## 2021-01-13 DIAGNOSIS — Z419 Encounter for procedure for purposes other than remedying health state, unspecified: Secondary | ICD-10-CM | POA: Diagnosis not present

## 2021-02-12 DIAGNOSIS — Z419 Encounter for procedure for purposes other than remedying health state, unspecified: Secondary | ICD-10-CM | POA: Diagnosis not present

## 2021-03-15 DIAGNOSIS — Z419 Encounter for procedure for purposes other than remedying health state, unspecified: Secondary | ICD-10-CM | POA: Diagnosis not present

## 2021-04-15 DIAGNOSIS — Z419 Encounter for procedure for purposes other than remedying health state, unspecified: Secondary | ICD-10-CM | POA: Diagnosis not present

## 2021-05-15 DIAGNOSIS — Z419 Encounter for procedure for purposes other than remedying health state, unspecified: Secondary | ICD-10-CM | POA: Diagnosis not present

## 2021-06-15 DIAGNOSIS — Z419 Encounter for procedure for purposes other than remedying health state, unspecified: Secondary | ICD-10-CM | POA: Diagnosis not present

## 2021-07-15 DIAGNOSIS — Z419 Encounter for procedure for purposes other than remedying health state, unspecified: Secondary | ICD-10-CM | POA: Diagnosis not present

## 2021-08-15 DIAGNOSIS — Z419 Encounter for procedure for purposes other than remedying health state, unspecified: Secondary | ICD-10-CM | POA: Diagnosis not present

## 2021-09-15 DIAGNOSIS — Z419 Encounter for procedure for purposes other than remedying health state, unspecified: Secondary | ICD-10-CM | POA: Diagnosis not present

## 2021-10-13 DIAGNOSIS — Z419 Encounter for procedure for purposes other than remedying health state, unspecified: Secondary | ICD-10-CM | POA: Diagnosis not present

## 2021-11-13 DIAGNOSIS — Z419 Encounter for procedure for purposes other than remedying health state, unspecified: Secondary | ICD-10-CM | POA: Diagnosis not present

## 2021-12-13 DIAGNOSIS — Z419 Encounter for procedure for purposes other than remedying health state, unspecified: Secondary | ICD-10-CM | POA: Diagnosis not present

## 2022-01-13 DIAGNOSIS — Z419 Encounter for procedure for purposes other than remedying health state, unspecified: Secondary | ICD-10-CM | POA: Diagnosis not present

## 2022-02-12 DIAGNOSIS — Z419 Encounter for procedure for purposes other than remedying health state, unspecified: Secondary | ICD-10-CM | POA: Diagnosis not present

## 2022-03-15 DIAGNOSIS — Z419 Encounter for procedure for purposes other than remedying health state, unspecified: Secondary | ICD-10-CM | POA: Diagnosis not present

## 2022-04-15 DIAGNOSIS — Z419 Encounter for procedure for purposes other than remedying health state, unspecified: Secondary | ICD-10-CM | POA: Diagnosis not present

## 2022-05-15 DIAGNOSIS — Z419 Encounter for procedure for purposes other than remedying health state, unspecified: Secondary | ICD-10-CM | POA: Diagnosis not present

## 2022-06-15 DIAGNOSIS — Z419 Encounter for procedure for purposes other than remedying health state, unspecified: Secondary | ICD-10-CM | POA: Diagnosis not present

## 2022-07-15 DIAGNOSIS — Z419 Encounter for procedure for purposes other than remedying health state, unspecified: Secondary | ICD-10-CM | POA: Diagnosis not present

## 2022-08-15 DIAGNOSIS — Z419 Encounter for procedure for purposes other than remedying health state, unspecified: Secondary | ICD-10-CM | POA: Diagnosis not present

## 2022-09-15 DIAGNOSIS — Z419 Encounter for procedure for purposes other than remedying health state, unspecified: Secondary | ICD-10-CM | POA: Diagnosis not present

## 2022-10-14 DIAGNOSIS — Z419 Encounter for procedure for purposes other than remedying health state, unspecified: Secondary | ICD-10-CM | POA: Diagnosis not present

## 2022-11-14 DIAGNOSIS — Z419 Encounter for procedure for purposes other than remedying health state, unspecified: Secondary | ICD-10-CM | POA: Diagnosis not present

## 2022-12-14 DIAGNOSIS — Z419 Encounter for procedure for purposes other than remedying health state, unspecified: Secondary | ICD-10-CM | POA: Diagnosis not present

## 2023-01-14 DIAGNOSIS — Z419 Encounter for procedure for purposes other than remedying health state, unspecified: Secondary | ICD-10-CM | POA: Diagnosis not present
